# Patient Record
Sex: Male | Born: 1995 | Race: White | Hispanic: No | Marital: Married | Smoking: Never smoker
Health system: Southern US, Community
[De-identification: ages and names within clinical notes are randomized; demographics above are authoritative.]

## PROBLEM LIST (undated history)

## (undated) DIAGNOSIS — Z860100 Personal history of colon polyps, unspecified: Secondary | ICD-10-CM

## (undated) HISTORY — PX: OTHER SURGICAL HISTORY: SHX169

## (undated) HISTORY — DX: Personal history of colonic polyps: Z86.010

## (undated) HISTORY — DX: Personal history of colon polyps, unspecified: Z86.0100

---
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
## 2017-08-05 ENCOUNTER — Encounter: Payer: Self-pay | Admitting: Podiatry

## 2017-08-05 ENCOUNTER — Ambulatory Visit (INDEPENDENT_AMBULATORY_CARE_PROVIDER_SITE_OTHER): Payer: BLUE CROSS/BLUE SHIELD | Admitting: Podiatry

## 2017-08-05 DIAGNOSIS — L600 Ingrowing nail: Secondary | ICD-10-CM

## 2017-08-05 MED ORDER — DOXYCYCLINE HYCLATE 100 MG PO TABS: 100.0000 mg | ORAL_TABLET | Freq: Two times a day (BID) | ORAL | 0 refills | Status: DC

## 2017-08-05 NOTE — Patient Instructions (Signed)

## 2017-08-05 NOTE — Progress Notes (Signed)
   Subjective:    Patient ID: Andre Fisher, male    DOB: 06/04/1996, 21 y.o.   MRN: [id number]  HPI    Review of Systems     Objective:   Physical Exam        Assessment & Plan:

## 2017-08-08 NOTE — Progress Notes (Signed)
   Subjective: Patient presents today for evaluation of pain to the lateral border of the left great toe that has been present for the past 4-5 days. He reports associated redness and swelling to the area. Patient is concerned for possible ingrown nail. Patient presents today for further treatment and evaluation.   No past medical history on file.   Objective:  General: Well developed, nourished, in no acute distress, alert and oriented x3   Dermatology: Skin is warm, dry and supple bilateral. Lateral border of the left great toe appears to be erythematous with evidence of an ingrowing nail. Pain on palpation noted to the border of the nail fold. The remaining nails appear unremarkable at this time. There are no open sores, lesions.  Vascular: Dorsalis Pedis artery and Posterior Tibial artery pedal pulses palpable. No lower extremity edema noted.   Neruologic: Grossly intact via light touch bilateral.  Musculoskeletal: Muscular strength within normal limits in all groups bilateral. Normal range of motion noted to all pedal and ankle joints.   Assesement: #1 Paronychia with ingrowing nail lateral border of the left great toe #2 Pain in toe #3 Incurvated nail  Plan of Care:  1. Patient evaluated.  2. Discussed treatment alternatives and plan of care. Explained nail avulsion procedure and post procedure course to patient. 3. Patient opted for permanent partial nail avulsion.  4. Prior to procedure, local anesthesia infiltration utilized using 3 ml of a 50:50 mixture of 2% plain lidocaine and 0.5% plain marcaine in a normal hallux block fashion and a betadine prep performed.  5. Partial permanent nail avulsion with chemical matrixectomy performed using 3x30sec applications of phenol followed by alcohol flush.  6. Light dressing applied. 7. Prescription for doxycycline # 14 given to patient. 8. Return to clinic in 2 weeks.   Felecia ShellingBrent M. Avriana Joo, DPM [facility]  Dr. Felecia ShellingBrent M.  Cynai Skeens, DPM    [address]                                        [place], [place] [address]                Office [phone]) [phone]  Fax [phone]

## 2017-08-19 ENCOUNTER — Ambulatory Visit (INDEPENDENT_AMBULATORY_CARE_PROVIDER_SITE_OTHER): Payer: BLUE CROSS/BLUE SHIELD | Admitting: Podiatry

## 2017-08-19 DIAGNOSIS — L600 Ingrowing nail: Secondary | ICD-10-CM | POA: Diagnosis not present

## 2017-08-22 NOTE — Progress Notes (Signed)
   Subjective: Patient presents today 2 weeks post ingrown nail permanent nail avulsion procedure. Patient states that the toe and nail fold is feeling much better.  No past medical history on file.  Objective: Skin is warm, dry and supple. Nail and respective nail fold appears to be healing appropriately. Open wound to the associated nail fold with a granular wound base and moderate amount of fibrotic tissue. Minimal drainage noted. Mild erythema around the periungual region likely due to phenol chemical matricectomy.  Assessment: #1 postop permanent partial nail avulsion lateral border of left great toe #2 open wound periungual nail fold of respective digit.   Plan of care: #1 patient was evaluated  #2 debridement of open wound was performed to the periungual border of the respective toe using a currette. Antibiotic ointment and Band-Aid was applied. #3 patient is to return to clinic on a PRN  basis.   Felecia ShellingBrent M. Shalunda Lindh, DPM [facility]  Dr. Felecia ShellingBrent M. Kayston Jodoin, DPM    [address]                                        [place], [place] [address]                Office [phone]  Fax [phone]

## 2022-01-06 ENCOUNTER — Encounter: Payer: Self-pay | Admitting: Nurse Practitioner

## 2022-01-06 ENCOUNTER — Ambulatory Visit (INDEPENDENT_AMBULATORY_CARE_PROVIDER_SITE_OTHER): Payer: BC Managed Care – PPO | Admitting: Nurse Practitioner

## 2022-01-06 ENCOUNTER — Other Ambulatory Visit: Payer: Self-pay

## 2022-01-06 VITALS — BP 126/84 | HR 75 | Temp 98.0°F | Resp 14 | Ht 67.25 in | Wt 193.2 lb

## 2022-01-06 DIAGNOSIS — Z23 Encounter for immunization: Secondary | ICD-10-CM | POA: Diagnosis not present

## 2022-01-06 DIAGNOSIS — Z0000 Encounter for general adult medical examination without abnormal findings: Secondary | ICD-10-CM

## 2022-01-06 DIAGNOSIS — Z86010 Personal history of colonic polyps: Secondary | ICD-10-CM | POA: Diagnosis not present

## 2022-01-06 NOTE — Progress Notes (Signed)
? ?New Patient Office Visit ? ?Subjective:  ?Patient ID: Andre Fisher, male    DOB: [birth year]  Age: 26 y.o. MRN: [id number] ? ?CC:  ?Chief Complaint  ?Patient presents with  ? Establish Care  ?  Previous PCP with [facility].  ? ? ?HPI ?Andre Fisher presents for Establish care ? ?for complete physical and follow up of chronic conditions. ? ?Immunizations: ?-Tetanus: update today ?-Influenza: refused ?-Covid-19: refused ?-Shingles: NA ?-Pneumonia: NA ? ? ? ?Diet: Fair diet. Depends on how he feels at work. Lunch and dinner with snacks. Coffee, water and sodas.2-3 sodas a day ?Exercise: No regular exercise. Laborious employment ? ?Eye exam:  within the year. PRN ?Dental exam: Completes semi-annually.   ? ? ?Colonoscopy:  too young ?Dexa: Completed in ?PSA: Due ? ?Lung Cancer Screening: N/A ? ?Sleep: work nights goes to be 10-11 gets up approx 6am. Weekends 12-1 to 8-9. Feels rested. Snoring per wife's report. ? ?Past Medical History:  ?Diagnosis Date  ? History of colon polyps   ? ? ? ?Family History  ?Problem Relation Age of Onset  ? Hyperlipidemia Mother   ? Heart disease Mother   ? COPD Maternal Grandmother   ? Stroke Maternal Grandmother   ? Heart disease Maternal Grandmother   ? Heart attack Maternal Grandmother   ? Cirrhosis Maternal Grandfather   ? ? ?Social History  ? ?Socioeconomic History  ? Marital status: Married  ?  Spouse name: Not on file  ? Number of children: 0  ? Years of education: Not on file  ? Highest education level: Not on file  ?Occupational History  ? Not on file  ?Tobacco Use  ? Smoking status: Never  ? Smokeless tobacco: Never  ?Vaping Use  ? Vaping Use: Never used  ?Substance and Sexual Activity  ? Alcohol use: No  ? Drug use: Never  ? Sexual activity: Yes  ?Other Topics Concern  ? Not on file  ?Social History Narrative  ? 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   ? Hobbie: video games and being outside  ? ?Social Determinants of Health  ? ?Financial Resource Strain:  Not on file  ?Food Insecurity: Not on file  ?Transportation Needs: Not on file  ?Physical Activity: Not on file  ?Stress: Not on file  ?Social Connections: Not on file  ?Intimate Partner Violence: Not on file  ? ? ?ROS ?Review of Systems  ?Constitutional:  Negative for chills, fatigue and fever.  ?Eyes:  Negative for visual disturbance.  ?Respiratory:  Negative for cough and shortness of breath.   ?Cardiovascular:  Negative for chest pain and leg swelling.  ?Gastrointestinal:  Negative for diarrhea, nausea and vomiting.  ?     BM daily ?  ?Genitourinary:  Negative for difficulty urinating, dysuria, hematuria, penile discharge, penile pain, penile swelling, scrotal swelling and testicular pain.  ?     Nocturia "-"  ?Neurological:  Positive for headaches. Negative for weakness and numbness.  ?Psychiatric/Behavioral:  Negative for hallucinations and suicidal ideas.   ? ?Objective:  ? ?Today's Vitals: BP 126/84   Pulse 75   Temp 98 ?F (36.7 ?C)   Resp 14   Ht 5' 7.25" (1.708 m)   Wt 193 lb 4 oz (87.7 kg)   SpO2 97%   BMI 30.04 kg/m?  ? ?Physical Exam ?Vitals and nursing note reviewed. Exam conducted with a chaperone present Karie Georges, CMA).  ?Constitutional:   ?   Appearance: Normal appearance.  ?  HENT:  ?   Right Ear: Tympanic membrane, ear canal and external ear normal. There is no impacted cerumen.  ?   Left Ear: Tympanic membrane, ear canal and external ear normal. There is no impacted cerumen.  ?   Mouth/Throat:  ?   Mouth: Mucous membranes are moist.  ?   Pharynx: Oropharynx is clear.  ?Eyes:  ?   Extraocular Movements: Extraocular movements intact.  ?   Pupils: Pupils are equal, round, and reactive to light.  ?Neck:  ?   Thyroid: No thyroid mass, thyromegaly or thyroid tenderness.  ?Cardiovascular:  ?   Rate and Rhythm: Normal rate and regular rhythm.  ?   Pulses: Normal pulses.  ?   Heart sounds: Normal heart sounds.  ?Pulmonary:  ?   Effort: Pulmonary effort is normal.  ?   Breath sounds: Normal  breath sounds.  ?Abdominal:  ?   General: Bowel sounds are normal. There is no distension.  ?   Palpations: There is no mass.  ?   Tenderness: There is no abdominal tenderness.  ?   Hernia: No hernia is present. There is no hernia in the left inguinal area or right inguinal area.  ?Genitourinary: ?   Penis: Normal and circumcised.   ?   Testes: Normal.  ?   Epididymis:  ?   Right: Normal.  ?   Left: Normal.  ?Lymphadenopathy:  ?   Cervical: No cervical adenopathy.  ?   Lower Body: No right inguinal adenopathy. No left inguinal adenopathy.  ?Neurological:  ?   Mental Status: He is alert.  ? ? ?Assessment & Plan:  ? ?Problem List Items Addressed This Visit   ? ?  ? Other  ? Preventative health care - Primary  ?  Discussed age-appropriate immunizations and screening exams.  Did encourage healthy lifestyle modifications inclusive of 30 minutes of exercise daily 5 times a week.  Update tetanus shot today ?  ?  ? Relevant Orders  ? CBC  ? Comprehensive metabolic panel  ? Lipid panel  ? Hemoglobin A1c  ? TSH  ? HIV Antibody (routine testing w rflx)  ? Need for Td vaccine  ? Relevant Orders  ? Td : Tetanus/diphtheria >7yo Preservative  free (Completed)  ? History of colon polyps  ?  Accompanied by mom in office.  States that patient did have a polyp removed from his colon was approximately 5 years ago.  States they were able to take it by the stem and no other follow-up was required.  States that patient was likely born with a birth.  Did question whether this was a hemorrhoid or not mother states no that it was a polyp. ?  ?  ? ? ?Outpatient Encounter Medications as of 01/06/2022  ?Medication Sig  ? [DISCONTINUED] doxycycline (VIBRA-TABS) 100 MG tablet Take 1 tablet (100 mg total) by mouth 2 (two) times daily.  ? ?No facility-administered encounter medications on file as of 01/06/2022.  ? ? ?Follow-up: Return in about 1 year (around 01/07/2023) for cpe.  ? ?This visit occurred during the SARS-CoV-2 public health emergency.   Safety protocols were in place, including screening questions prior to the visit, additional usage of staff PPE, and extensive cleaning of exam room while observing appropriate contact time as indicated for disinfecting solutions.  ? ?Audria Nine, NP ? ?

## 2022-01-06 NOTE — Assessment & Plan Note (Signed)
Discussed age-appropriate immunizations and screening exams.  Did encourage healthy lifestyle modifications inclusive of 30 minutes of exercise daily 5 times a week.  Update tetanus shot today ?

## 2022-01-06 NOTE — Assessment & Plan Note (Signed)
Accompanied by mom in office.  States that patient did have a polyp removed from his colon was approximately 5 years ago.  States they were able to take it by the stem and no other follow-up was required.  States that patient was likely born with a birth.  Did question whether this was a hemorrhoid or not mother states no that it was a polyp. ?

## 2022-01-06 NOTE — Patient Instructions (Signed)
Nice to see you today ?I will be in touch in regards to your lab results ?Follow up with me in 1 year, sooner if you need me ? ?They recommend that you do 30 mins of activity a day 5 days out of the week ?

## 2022-01-07 ENCOUNTER — Ambulatory Visit: Payer: Self-pay | Admitting: Adult Health

## 2022-01-07 LAB — COMPREHENSIVE METABOLIC PANEL
ALT: 24 U/L (ref 0–53)
AST: 21 U/L (ref 0–37)
Albumin: 4.8 g/dL (ref 3.5–5.2)
Alkaline Phosphatase: 64 U/L (ref 39–117)
BUN: 13 mg/dL (ref 6–23)
CO2: 30 mEq/L (ref 19–32)
Calcium: 9.2 mg/dL (ref 8.4–10.5)
Chloride: 102 mEq/L (ref 96–112)
Creatinine, Ser: 0.99 mg/dL (ref 0.40–1.50)
GFR: 104.61 mL/min (ref >60.00)
Glucose, Bld: 79 mg/dL (ref 70–99)
Potassium: 4.5 mEq/L (ref 3.5–5.1)
Sodium: 140 mEq/L (ref 135–145)
Total Bilirubin: 0.6 mg/dL (ref 0.2–1.2)
Total Protein: 7 g/dL (ref 6.0–8.3)

## 2022-01-07 LAB — CBC
HCT: 42.9 % (ref 39.0–52.0)
Hemoglobin: 14.8 g/dL (ref 13.0–17.0)
MCHC: 34.4 g/dL (ref 30.0–36.0)
MCV: 85.7 fl (ref 78.0–100.0)
Platelets: 317 10*3/uL (ref 150.0–400.0)
RBC: 5.01 Mil/uL (ref 4.22–5.81)
RDW: 13.2 % (ref 11.5–15.5)
WBC: 7.1 10*3/uL (ref 4.0–10.5)

## 2022-01-07 LAB — LIPID PANEL
Cholesterol: 217 mg/dL — ABNORMAL HIGH (ref 0–200)
HDL: 46.8 mg/dL (ref >39.00)
LDL Cholesterol: 148 mg/dL — ABNORMAL HIGH (ref 0–99)
NonHDL: 170.18
Total CHOL/HDL Ratio: 5
Triglycerides: 113 mg/dL (ref 0.0–149.0)
VLDL: 22.6 mg/dL (ref 0.0–40.0)

## 2022-01-07 LAB — HIV ANTIBODY (ROUTINE TESTING W REFLEX): HIV 1&2 Ab, 4th Generation: NONREACTIVE

## 2022-01-07 LAB — HEMOGLOBIN A1C: Hgb A1c MFr Bld: 5.6 % (ref 4.6–6.5)

## 2022-01-07 LAB — TSH: TSH: 2.37 u[IU]/mL (ref 0.35–5.50)

## 2022-01-13 ENCOUNTER — Telehealth: Payer: Self-pay

## 2022-01-13 NOTE — Telephone Encounter (Signed)
Spoke with patient, I called to relay lab results. ?

## 2022-01-13 NOTE — Telephone Encounter (Signed)
[facility] Night - Client ?Nonclinical Telephone Record  ?AccessNurse? ?Client [facility] Night - Client ?Client Site [facility] - Night ?Provider AA - PHYSICIAN, UNKNOWN- MD ?Contact Type Call ?Who Is Calling Patient / Member / Family / Caregiver ?Caller Name Andre Fisher ?Caller Phone Number [phone] ?Call Type Message Only Information Provided ?Reason for Call Returning a Call from the Office ?Initial Comment Caller says that he had a call a bit ago but missed the call and wants to know why they called. ?Disp. Time Disposition Final User ?01/12/2022 5:03:13 PM General Information Provided Yes Einar Pheasant ?Call Closed By: Einar Pheasant ?Transaction Date/Time: 01/12/2022 5:01:12 PM (ET ?

## 2022-03-05 ENCOUNTER — Ambulatory Visit (INDEPENDENT_AMBULATORY_CARE_PROVIDER_SITE_OTHER)
Admission: RE | Admit: 2022-03-05 | Discharge: 2022-03-05 | Disposition: A | Discharge status: Home / Self Care | Payer: BC Managed Care – PPO | Source: Ambulatory Visit | Attending: Nurse Practitioner | Admitting: Nurse Practitioner

## 2022-03-05 ENCOUNTER — Ambulatory Visit: Payer: BC Managed Care – PPO | Admitting: Nurse Practitioner

## 2022-03-05 ENCOUNTER — Encounter: Payer: Self-pay | Admitting: Nurse Practitioner

## 2022-03-05 VITALS — BP 116/62 | HR 73 | Temp 97.5°F | Wt 190.4 lb

## 2022-03-05 DIAGNOSIS — G8929 Other chronic pain: Secondary | ICD-10-CM

## 2022-03-05 DIAGNOSIS — M542 Cervicalgia: Secondary | ICD-10-CM | POA: Diagnosis not present

## 2022-03-05 DIAGNOSIS — M546 Pain in thoracic spine: Secondary | ICD-10-CM | POA: Diagnosis not present

## 2022-03-05 DIAGNOSIS — M40202 Unspecified kyphosis, cervical region: Secondary | ICD-10-CM | POA: Diagnosis not present

## 2022-03-05 MED ORDER — CYCLOBENZAPRINE HCL 5 MG PO TABS: 5.0000 mg | ORAL_TABLET | Freq: Two times a day (BID) | ORAL | 0 refills | Status: DC | PRN

## 2022-03-05 NOTE — Patient Instructions (Signed)
Nice to see you today I will be in touch with xray results I sent in the muscle relaxer. This can cause you to be sleepy. Follow up if no improvement

## 2022-03-05 NOTE — Progress Notes (Signed)
Acute Office Visit  Subjective:     Patient ID: Andre Fisher, male    DOB: [birth year], 26 y.o.   MRN: [id number]  Chief Complaint  Patient presents with   Neck Pain    Neck pain,for a while, comes and goes. Some days are better than others.   Back Pain    Mid and lower back pain, some days are better than others. Always feels pain at the end of day after work. Used to take Bayer back and body. Has been going on for years.      Patient is in today for back pain  Neck: approx a month ago. States that the pain is intermittent. Describes the pain as a tightness. Worse at work. Has tried bayer back and body in the past but this was before the neck are bothering him.  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  Back: Bothering him for years. 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 States somedays are better than others. The more physical the day the worse it is. States that he has seen a Land. Felt the chiropractor was helpful. States he will take bayer back and body, with minimal relief Xrays approx 2 years ago his chiropractor that showed curvature discrepancies in regards to the spine and iliac crest symmetry per x-rays on patient's phone. Has not done PT in the past.  At the end of the office visit patient had concern for lump to his right lower thoracic right side medial back.  This was an incidental finding by patient's spouse.  It is nontender nonrigid movable.  Review of Systems  Constitutional:  Negative for chills and fever.  Genitourinary:        No B&B involvement   Musculoskeletal:  Positive for back pain and neck pain.  Neurological:  Negative for tingling and weakness.       Objective:    BP 116/62 (BP Location: Left Arm, Patient Position: Sitting, Cuff Size: Normal)   Pulse 73   Temp (!) 97.5 F (36.4 C) (Temporal)   Wt 190 lb 6.4 oz (86.4 kg)   SpO2 97%   BMI 29.60 kg/m    Physical Exam Vitals and nursing note reviewed.   Constitutional:      Appearance: Normal appearance.  Cardiovascular:     Rate and Rhythm: Normal rate and regular rhythm.     Pulses: Normal pulses.     Heart sounds: Normal heart sounds.  Pulmonary:     Effort: Pulmonary effort is normal.     Breath sounds: Normal breath sounds.  Musculoskeletal:        General: Tenderness present. No signs of injury.     Cervical back: Tenderness present. No bony tenderness or crepitus. No pain with movement.     Thoracic back: Tenderness present. No swelling, deformity or bony tenderness. Normal range of motion.     Lumbar back: No tenderness. Negative right straight leg raise test and negative left straight leg raise test.       Back:     Right lower leg: No edema.     Left lower leg: No edema.  Skin:    General: Skin is warm.       Neurological:     General: No focal deficit present.     Mental Status: He is alert.     Comments: Bilateral upper and lower extremity strength 5/5    No results found for any visits on 03/05/22.  Assessment & Plan:   Problem List Items Addressed This Visit       Other   Chronic bilateral thoracic back pain    Likely musculature in nature given exam and patient's presentation.  Patient requested and wanted to proceed with x-rays today.  We will obtain thoracic and cervical views.  Patient can take Flexeril 5 mg twice daily as needed.  Sedation precaution reviewed with patient       Relevant Medications   cyclobenzaprine (FLEXERIL) 5 MG tablet   Other Relevant Orders   DG Thoracic Spine W/Swimmers   Neck pain - Primary    Paraspinal area.  Musculature in nature we will start Flexeril 5 mg twice daily as needed, sedation precautions reviewed.  Patient would like to have x-ray performed in office today cervical spine x-ray ordered, pending result       Relevant Medications   cyclobenzaprine (FLEXERIL) 5 MG tablet   Other Relevant Orders   DG Cervical Spine Complete    Meds ordered this  encounter  Medications   cyclobenzaprine (FLEXERIL) 5 MG tablet    Sig: Take 1 tablet (5 mg total) by mouth 2 (two) times daily as needed for muscle spasms.    Dispense:  20 tablet    Refill:  0    Order Specific Question:   Supervising Provider    Answer:   TOWER, MARNE A [[id number]    Return if symptoms worsen or fail to improve.  Audria Nine, NP

## 2022-03-05 NOTE — Assessment & Plan Note (Signed)
Paraspinal area.  Musculature in nature we will start Flexeril 5 mg twice daily as needed, sedation precautions reviewed.  Patient would like to have x-ray performed in office today cervical spine x-ray ordered, pending result

## 2022-03-05 NOTE — Assessment & Plan Note (Signed)
Likely musculature in nature given exam and patient's presentation.  Patient requested and wanted to proceed with x-rays today.  We will obtain thoracic and cervical views.  Patient can take Flexeril 5 mg twice daily as needed.  Sedation precaution reviewed with patient

## 2022-03-07 ENCOUNTER — Encounter: Payer: Self-pay | Admitting: Nurse Practitioner

## 2022-03-07 DIAGNOSIS — G8929 Other chronic pain: Secondary | ICD-10-CM

## 2022-03-09 NOTE — Addendum Note (Signed)
Addended by: Michela Pitcher on: 03/09/2022 08:29 AM   Modules accepted: Orders

## 2022-04-08 ENCOUNTER — Telehealth: Payer: Self-pay | Admitting: Physical Therapy

## 2022-04-08 NOTE — Telephone Encounter (Signed)
Called pt to inquiry about whether he wanted to come in earlier to complete eval. He reports not being able to get off from work and that the soonest he could come in is on Monday.

## 2022-04-12 ENCOUNTER — Ambulatory Visit: Payer: BC Managed Care – PPO | Admitting: Physical Therapy

## 2022-04-19 ENCOUNTER — Encounter: Payer: Self-pay | Admitting: Physical Therapy

## 2022-04-19 ENCOUNTER — Ambulatory Visit: Payer: BC Managed Care – PPO | Attending: Nurse Practitioner | Admitting: Physical Therapy

## 2022-04-19 ENCOUNTER — Other Ambulatory Visit: Payer: Self-pay

## 2022-04-19 DIAGNOSIS — M5459 Other low back pain: Secondary | ICD-10-CM | POA: Diagnosis not present

## 2022-04-19 NOTE — Therapy (Unsigned)
OUTPATIENT PHYSICAL THERAPY LOWER EXTREMITY EVALUATION   Patient Name: Andre Fisher MRN: [id number] DOB:21-Apr-1996, 26 y.o., male Today's Date: 04/19/2022    Past Medical History:  Diagnosis Date   History of colon polyps    Past Surgical History:  Procedure Laterality Date   polyp removed     under right arm pit   Patient Active Problem List   Diagnosis Date Noted   Chronic bilateral thoracic back pain 03/05/2022   Neck pain 03/05/2022   Preventative health care 01/06/2022   Need for Td vaccine 01/06/2022   History of colon polyps 01/06/2022    PCP: ***  REFERRING PROVIDER: ***  REFERRING DIAG: ***  THERAPY DIAG:  No diagnosis found.  Rationale for Evaluation and Treatment Rehabilitation  ONSET DATE: ***  SUBJECTIVE:   SUBJECTIVE STATEMENT: Pt reports that his back has hurt for years, but his neck has started to hurt more as of late.   PERTINENT HISTORY: M54.6,G89.29 (ICD-10-CM) - Chronic bilateral thoracic back pain  Per Dr. Fayrene Fearing note on 03/05/22   Chronic bilateral thoracic back pain       Likely musculature in nature given exam and patient's presentation.  Patient requested and wanted to proceed with x-rays today.  We will obtain thoracic and cervical views.  Patient can take Flexeril 5 mg twice daily as needed.  Sedation precaution reviewed with patient  PAIN:  Are you having pain? Yes: NPRS scale: 5/10 Pain location: C8 Vertebrate and thoracic paraspinals  Pain description: None listed  Aggravating factors: lifting heavy objects  Relieving factors: Laying on hard floor or surface   PRECAUTIONS: None  WEIGHT BEARING RESTRICTIONS No  FALLS:  Has patient fallen in last 6 months? No  LIVING ENVIRONMENT: Lives with: lives with their spouse Lives in: House/apartment Stairs: Yes: External: 3 steps; on right going up and on left going up Has following equipment at home: None  OCCUPATION: Marketing executive Supply Chain Specialist   PLOF:  Independent  PATIENT GOALS Reduce pain    OBJECTIVE:               VITALS: BP 110/66 SpO2 98 HR 70  DIAGNOSTIC FINDINGS: CLINICAL DATA:  Thoracic pain.   EXAM: THORACIC SPINE - 3 VIEWS   COMPARISON:  None Available.   FINDINGS: There is no evidence of thoracic spine fracture. Alignment is normal. No other significant bone abnormalities are identified.   IMPRESSION: Negative.     Electronically Signed   By: Darliss Cheney M.D.   On: 03/06/2022 23:13   CLINICAL DATA:  Intermittent cervicalgia.   EXAM: CERVICAL SPINE - COMPLETE 4+ VIEW   COMPARISON:  None Available.   FINDINGS: There is no radiographic evidence of cervical spine fracture or prevertebral soft tissue swelling. There is a slight cervical kyphosis without evidence of listhesis. No other significant bone abnormalities are identified. Lung apices are clear.   IMPRESSION: Slight cervical kyphosis. No evidence of fracture, listhesis or degenerative change.     Electronically Signed   By: Almira Bar M.D.   On: 03/06/2022 23:15  PATIENT SURVEYS:  FOTO ***  COGNITION:  Overall cognitive status: Within functional limits for tasks assessed     SENSATION: WFL  EDEMA: N/a  MUSCLE LENGTH: Hamstrings: Right *** deg; Left *** deg [name] test: Right *** deg; Left *** deg  POSTURE: rounded shoulders  PALPATION: ***  CERVICAL:                                           AROM   PROM          NORM               flex:    50*         50*                         50                     ext:   60         60                          60           AROM R/L      PROM                          NORM     SB:                                                                       45  rot:                                                                    70-90                                 R      L    Normal   - shoulder flx     180 180          180  - shoulder abd    180 180          180  - shoulder ext     60    60             60  - combined mvt      (touch pt)         - ER       Occiput  Occiput  Occiput         - IR            T7        T7            T7 ER @ 90 deg abd   110 110            90 IR @ 90 deg abd    70   70              70    STRENGTH   Strength R/L 5/5 Shoulder flexion (  anterior deltoid/pec major/coracobrachialis, axillary n. (C5/6) and musculocutaneous n. (C5-7)) 5/5 Shoulder abduction (deltoid/supraspinatus, axillary/suprascapular n, C5) 5/5 Shoulder external rotation (infraspinatus/teres minor) 5/5 Shoulder internal rotation (subcapularis/lats/pec major) 5/5 Shoulder extension (posterior deltoid, lats, teres major, axillary/thoracodorsal n.) 5/5 Shoulder horizontal abduction 5/5 Elbow flexion (biceps brachii, brachialis, brachioradialis, musculoskeletal n, C5/6) 5/5 Elbow extension (triceps, radial n, C7) 5/5 Wrist Extension (C6/7) 5/5 Wrist Flexion (C6/7) 5/5 Finger adduction (interossei, ulnar n, T1) Cervical isometrics are strong in all directions;  Low Back:  Lumbar                Flexion: 100%              Extension: 100%             Side Bend R/L 100% 100%             Rotation R/L* 100%  Active ROM Right eval Left eval  Hip flexion    Hip extension    Hip abduction    Hip adduction    Hip internal rotation    Hip external rotation    Knee flexion    Knee extension    Ankle dorsiflexion    Ankle plantarflexion    Ankle inversion    Ankle eversion     (Blank rows = not tested)  LOWER EXTREMITY MMT:  MMT Right eval Left eval  Hip flexion    Hip extension    Hip abduction    Hip adduction    Hip internal rotation    Hip external rotation    Knee flexion    Knee extension    Ankle dorsiflexion    Ankle plantarflexion    Ankle inversion    Ankle eversion     (Blank rows = not tested)  LOWER EXTREMITY SPECIAL TESTS:  Hip special tests: Luisa Hart (FABER) test: negative, Thomas test: negative, Ober's test:  negative, Craig's test: negative, and Ely's test: negative  FUNCTIONAL TESTS:  {Functional tests:24029}  GAIT: Distance walked: *** Assistive device utilized: {Assistive devices:23999} Level of assistance: {Levels of assistance:24026} Comments: ***    TODAY'S TREATMENT: Levator Scapulae Stretch 2 x 30 sec     PATIENT EDUCATION:  Education details: *** Person educated: {Person educated:25204} Education method: {Education Method:25205} Education comprehension: {Education Comprehension:25206}   HOME EXERCISE PROGRAM: Access Code: [email or web address] URL: [email or web address] Date: 04/19/2022 Prepared by: Ellin Goodie  Exercises - Seated Upper Trapezius Stretch  - 1 x daily - 7 x weekly - 1 sets - 3 reps - 30 hold - Seated Levator Scapulae Stretch  - 1 x daily - 7 x weekly - 1 sets - 3 reps - 30 hold  ASSESSMENT:  CLINICAL IMPRESSION: Patient is a *** y.o. *** who was seen today for physical therapy evaluation and treatment for ***.    OBJECTIVE IMPAIRMENTS {opptimpairments:25111}.   ACTIVITY LIMITATIONS {activitylimitations:27494}  PARTICIPATION LIMITATIONS: {participationrestrictions:25113}  PERSONAL FACTORS {Personal factors:25162} are also affecting patient's functional outcome.   REHAB POTENTIAL: {rehabpotential:25112}  CLINICAL DECISION MAKING: {clinical decision making:25114}  EVALUATION COMPLEXITY: {Evaluation complexity:25115}   GOALS: Goals reviewed with patient? {yes/no:20286}  SHORT TERM GOALS: Target date: {follow up:25551}  *** Baseline: Goal status: {GOALSTATUS:25110}  2.  *** Baseline:  Goal status: {GOALSTATUS:25110}  3.  *** Baseline:  Goal status: {GOALSTATUS:25110}  4.  *** Baseline:  Goal status: {GOALSTATUS:25110}  5.  *** Baseline:  Goal status: {GOALSTATUS:25110}  6.  *** Baseline:  Goal status: {GOALSTATUS:25110}  LONG TERM GOALS: Target date: {follow up:25551}   *** Baseline:  Goal status:  {GOALSTATUS:25110}  2.  *** Baseline:  Goal status: {GOALSTATUS:25110}  3.  *** Baseline:  Goal status: {GOALSTATUS:25110}  4.  *** Baseline:  Goal status: {GOALSTATUS:25110}  5.  *** Baseline:  Goal status: {GOALSTATUS:25110}  6.  *** Baseline:  Goal status: {GOALSTATUS:25110}   PLAN: PT FREQUENCY: {rehab frequency:25116}  PT DURATION: {rehab duration:25117}  PLANNED INTERVENTIONS: {rehab planned interventions:25118::"Therapeutic exercises","Therapeutic activity","Neuromuscular re-education","Balance training","Gait training","Patient/Family education","Joint mobilization"}  PLAN FOR NEXT SESSION: ***   Johnn Hai, PT 04/19/2022, 6:12 PM

## 2022-04-21 ENCOUNTER — Ambulatory Visit: Payer: BC Managed Care – PPO | Admitting: Physical Therapy

## 2022-04-21 ENCOUNTER — Telehealth: Payer: Self-pay | Admitting: Physical Therapy

## 2022-04-21 NOTE — Telephone Encounter (Signed)
Attempted to call pt to inform him that original POC was for frequency of 1x per week instead of 2x per week. Unable to reach and unable to leave VM.

## 2022-04-26 ENCOUNTER — Encounter: Payer: BC Managed Care – PPO | Admitting: Physical Therapy

## 2022-04-28 ENCOUNTER — Encounter: Payer: BC Managed Care – PPO | Admitting: Physical Therapy

## 2022-04-29 ENCOUNTER — Encounter: Payer: Self-pay | Admitting: Physical Therapy

## 2022-04-29 ENCOUNTER — Ambulatory Visit: Payer: BC Managed Care – PPO | Admitting: Physical Therapy

## 2022-04-29 DIAGNOSIS — M5459 Other low back pain: Secondary | ICD-10-CM | POA: Diagnosis not present

## 2022-04-29 NOTE — Therapy (Signed)
OUTPATIENT PHYSICAL THERAPY TREATMENT NOTE   Patient Name: Andre Fisher MRN: [id number] DOB:June 29, 1996, 26 y.o., male Today's Date: 04/29/2022  PCP: Dr. Mordecai Maes  REFERRING PROVIDER: Dr. Mordecai Maes   END OF SESSION:   PT End of Session - 04/29/22 1554     Visit Number 2    Number of Visits 6    Date for PT Re-Evaluation 06/01/22    Authorization Type [employer] 60 PT/OT/ST    Authorization - Visit Number 2    Authorization - Number of Visits 60    Progress Note Due on Visit 10    PT Start Time 1550    PT Stop Time 1630    PT Time Calculation (min) 40 min    Activity Tolerance Patient tolerated treatment well    Behavior During Therapy [facility] for tasks assessed/performed             Past Medical History:  Diagnosis Date   History of colon polyps    Past Surgical History:  Procedure Laterality Date   polyp removed     under right arm pit   Patient Active Problem List   Diagnosis Date Noted   Chronic bilateral thoracic back pain 03/05/2022   Neck pain 03/05/2022   Preventative health care 01/06/2022   Need for Td vaccine 01/06/2022   History of colon polyps 01/06/2022    REFERRING DIAG: M54.6,G89.29 (ICD-10-CM) - Chronic bilateral thoracic back pain  THERAPY DIAG:  Other low back pain  Rationale for Evaluation and Treatment Rehabilitation  PERTINENT HISTORY:    Per Dr. Fayrene Fearing note on 03/05/22    Chronic bilateral thoracic back pain                                   Likely musculature in nature given exam and patient's presentation.  Patient requested and wanted to proceed with x-rays today.  We will obtain thoracic and cervical views.  Patient can take Flexeril 5 mg twice daily as needed.  Sedation precaution reviewed with patient  PRECAUTIONS: None   SUBJECTIVE: Pt reports ongoing neck pain and soreness. He experiences most of this pain while at work when looking up and down.  PAIN:  Are you having pain? Yes: NPRS scale: 4-5/10 Pain location: C7 right side  of spinous process  Pain description: Achy Aggravating factors: Flexing and extending head  Relieving factors: Not moving head    OBJECTIVE: (objective measures completed at initial evaluation unless otherwise dated)              VITALS: BP 110/66 SpO2 98 HR 70   DIAGNOSTIC FINDINGS: CLINICAL DATA:  Thoracic pain.   EXAM: THORACIC SPINE - 3 VIEWS   COMPARISON:  None Available.   FINDINGS: There is no evidence of thoracic spine fracture. Alignment is normal. No other significant bone abnormalities are identified.   IMPRESSION: Negative.     Electronically Signed   By: Darliss Cheney M.D.   On: 03/06/2022 23:13     CLINICAL DATA:  Intermittent cervicalgia.   EXAM: CERVICAL SPINE - COMPLETE 4+ VIEW   COMPARISON:  None Available.   FINDINGS: There is no radiographic evidence of cervical spine fracture or prevertebral soft tissue swelling. There is a slight cervical kyphosis without evidence of listhesis. No other significant bone abnormalities are identified. Lung apices are clear.   IMPRESSION: Slight cervical kyphosis. No evidence of fracture, listhesis or degenerative change.  Electronically Signed   By: Almira Bar M.D.   On: 03/06/2022 23:15   PATIENT SURVEYS:  FOTO 70/77   COGNITION:           Overall cognitive status: Within functional limits for tasks assessed                          SENSATION: WFL   EDEMA: N/a   MUSCLE LENGTH: Hamstrings: Right 90 deg; Left 90 deg Thomas test: Negative bilateral    POSTURE: rounded shoulders   PALPATION: C7 spinous process and thoracic paraspinals                           CERVICAL:                                           AROM   PROM          NORM               flex:    50*         50*                         50                     ext:   60         60                          60            AROM R/L      PROM                            NORM     SB:       45/45                                                           45  rot:     70/70                                                              70-90                                  R      L    Normal   - shoulder flx     180 180          180  - shoulder abd    180 180         180  - shoulder ext     60    60             60  - combined mvt      (  touch pt)         - ER       Occiput  Occiput  Occiput         - IR            T7        T7            T7 ER @ 90 deg abd   110 110            90 IR @ 90 deg abd    70   70              70       STRENGTH    Strength R/L 5/5 Shoulder flexion (anterior deltoid/pec major/coracobrachialis, axillary n. (C5/6) and musculocutaneous n. (C5-7)) 5/5 Shoulder abduction (deltoid/supraspinatus, axillary/suprascapular n, C5) 5/5 Shoulder external rotation (infraspinatus/teres minor) 5/5 Shoulder internal rotation (subcapularis/lats/pec major) 5/5 Shoulder extension (posterior deltoid, lats, teres major, axillary/thoracodorsal n.) 5/5 Shoulder horizontal abduction 5/5 Elbow flexion (biceps brachii, brachialis, brachioradialis, musculoskeletal n, C5/6) 5/5 Elbow extension (triceps, radial n, C7) 5/5 Wrist Extension (C6/7) 5/5 Wrist Flexion (C6/7) 5/5 Finger adduction (interossei, ulnar n, T1) Cervical isometrics are strong in all directions;   Parascapular Strength  5/5 Mid Trap  4/4  Lower Trap  4/4  Serratus        Lumbar                 Flexion: 100%              Extension: 100%             Side Bend R/L 100% 100%             Rotation R/L* 100%   100%     Active  Right 04/29/2022 Left 04/29/2022  Hip flexion 120 120  Hip extension 30 30  Hip abduction 45 45  Hip adduction 30 30  Hip internal rotation 45 45  Hip external rotation 45 45  Knee flexion 135 135  Knee extension 0 0  Ankle dorsiflexion 20 20  Ankle plantarflexion 50 50  Ankle inversion 35 35  Ankle eversion 15 15   (Blank rows = not tested)        LOWER EXTREMITY MMT:   MMT Right eval Left eval   Hip flexion 5 5  Hip extension 5 5  Hip abduction 5 5  Hip adduction      Hip internal rotation 5 5  Hip external rotation 5 5  Knee flexion 5 5  Knee extension 5 5  Ankle dorsiflexion 5 5  Ankle plantarflexion 5 5  Ankle inversion      Ankle eversion       (Blank rows = not tested)   LOWER EXTREMITY SPECIAL TESTS:  Hip special tests: Luisa Hart (FABER) test: negative, Thomas test: negative, Ober's test: negative, Craig's test: negative, and Ely's test: negative   FUNCTIONAL TESTS:  None performed    GAIT: Distance walked: 40 ft  Assistive device utilized: None Level of assistance: Complete Independence Comments: No gait abnormalities noted        TODAY'S TREATMENT: 04/29/22:           Lower Trap MMT 4/4           Mid Trap MMT 5/5            Serratus MMT 4/4  D2 PNF Pattern with Green TB 3 x 10  Wall Push Up with Plus 3 x 10             Omega Face Pulls #5 3 x 10             -min VC to decrease thoracic extension            Seated Rows #5 3 x 10             Decreased left cervical side bend compared to right   Initial: Levator Scapulae Stretch 2 x 30 sec  Upper Trap Stretch 2 x 30 sec        PATIENT EDUCATION:  Education details: form and technique for appropriate exercise  Person educated: Patient and Spouse Education method: Explanation, Facilities manager, Verbal cues, and Handouts Education comprehension: verbalized understanding, returned demonstration, and verbal cues required     HOME EXERCISE PROGRAM: Access Code: [email or web address] URL: [email or web address] Date: 04/29/2022 Prepared by: Ellin Goodie  Exercises - Seated Upper Trapezius Stretch  - 1 x daily - 7 x weekly - 1 sets - 3 reps - 30 hold - Seated Levator Scapulae Stretch  - 1 x daily - 7 x weekly - 1 sets - 3 reps - 30 hold - Face Pulls  - 1 x daily - 3 x weekly - 3 sets - 10 reps - Wall Push Up with Plus  - 1 x daily - 3 x weekly - 3 sets - 10 reps - Shoulder PNF D2 with  Resistance  - 1 x daily - 3 x weekly - 3 sets - 10 reps - Seated Row Cable Machine  - 1 x daily - 3 x weekly - 3 sets - 10 reps   ASSESSMENT:   CLINICAL IMPRESSION: Pt continues to show decreased left cervical side bend that is due to increased muscular tension in right trap. Increased left cervical side bend with right shoulder elevation. Pt also shows decrease in parascapular strength. He was able to complete all exercises without an increase in pain and parascapular exercises were progressed. He will continue to benefit from skilled PT to decrease cervical and thoracic pain to carry out lifting and bending required for job related tasks.     OBJECTIVE IMPAIRMENTS pain.    ACTIVITY LIMITATIONS lifting and bending   PARTICIPATION LIMITATIONS: occupation   PERSONAL FACTORS Profession are also affecting patient's functional outcome.    REHAB POTENTIAL: Excellent   CLINICAL DECISION MAKING: Stable/uncomplicated   EVALUATION COMPLEXITY: Low     GOALS: Goals reviewed with patient? No   SHORT TERM GOALS: Target date: 05/04/2022  Pt will be independent with HEP in order to improve strength and balance in order to decrease fall risk and improve function at home and work. Baseline: NT  Goal status: INITIAL     LONG TERM GOALS: Target date: 06/01/2022    Patient will have improved function and activity level as evidenced by an increase in FOTO score by 7 points.   Baseline: 70  Goal status: INITIAL   2.  Patient will increase parascapular strength by >=1/2 MMT to offload mechanical forces on spine to be able to lift boxes for job with less pain.  Baseline: NT Goal status: INITIAL     PLAN: PT FREQUENCY: 1-2x/week   PT DURATION: 6 weeks   PLANNED INTERVENTIONS: Therapeutic exercises, Therapeutic activity, Balance training, Gait training, Patient/Family education, Joint manipulation, Joint mobilization, Aquatic Therapy, Dry Needling, Electrical stimulation, Cryotherapy, Moist  heat, Manual therapy, and Re-evaluation   PLAN FOR NEXT SESSION: Progress  parascapular strength, sitting posture and lifting posture    Ellin Goodieaniel Gwenivere Hiraldo PT, DPT  04/29/2022, 3:55 PM

## 2022-05-03 ENCOUNTER — Encounter: Payer: BC Managed Care – PPO | Admitting: Physical Therapy

## 2022-05-05 ENCOUNTER — Encounter: Payer: BC Managed Care – PPO | Admitting: Physical Therapy

## 2022-05-10 ENCOUNTER — Encounter: Payer: BC Managed Care – PPO | Admitting: Physical Therapy

## 2022-05-12 ENCOUNTER — Encounter: Payer: BC Managed Care – PPO | Admitting: Physical Therapy

## 2022-05-13 ENCOUNTER — Encounter: Payer: Self-pay | Admitting: Physical Therapy

## 2022-05-13 ENCOUNTER — Ambulatory Visit: Payer: BC Managed Care – PPO | Admitting: Physical Therapy

## 2022-05-13 DIAGNOSIS — M5459 Other low back pain: Secondary | ICD-10-CM

## 2022-05-13 NOTE — Therapy (Signed)
OUTPATIENT PHYSICAL THERAPY TREATMENT NOTE   Patient Name: Andre ParodyDylan Fisher MRN: [id number] DOB:12/10/1995, 26 y.o., male Today's Date: 05/13/2022  PCP: Dr. Mordecai MaesJames Cable  REFERRING PROVIDER: Dr. Mordecai MaesJames Cable   END OF SESSION:   PT End of Session - 05/13/22 0853     Visit Number 3    Number of Visits 6    Date for PT Re-Evaluation 06/01/22    Authorization Type BC[employer] 60 PT/OT/ST    Authorization - Visit Number 3    Authorization - Number of Visits 60    Progress Note Due on Visit 10    PT Start Time 0850    PT Stop Time 0930    PT Time Calculation (min) 40 min    Activity Tolerance Patient tolerated treatment well    [facility] assessed/performed             Past Medical History:  Diagnosis Date   History of colon polyps    Past Surgical History:  Procedure Laterality Date   polyp removed     under right arm pit   Patient Active Problem List   Diagnosis Date Noted   Chronic bilateral thoracic back pain 03/05/2022   Neck pain 03/05/2022   Preventative health care 01/06/2022   Need for Td vaccine 01/06/2022   History of colon polyps 01/06/2022    REFERRING DIAG: M54.6,G89.29 (ICD-10-CM) - Chronic bilateral thoracic back pain  THERAPY DIAG:  Other low back pain  Rationale for Evaluation and Treatment Rehabilitation  PERTINENT HISTORY:    Per Dr. Fayrene FearingJames note on 03/05/22    Chronic bilateral thoracic back pain                                   Likely musculature in nature given exam and patient's presentation.  Patient requested and wanted to proceed with x-rays today.  We will obtain thoracic and cervical views.  Patient can take Flexeril 5 mg twice daily as needed.  Sedation precaution reviewed with patient  PRECAUTIONS: None   SUBJECTIVE: Pt reports some relief of pain and soreness in neck and shoulder blades. He describes feeling some stiffness because he has been working increased hours for past couple of days.   PAIN:  Are you  having pain? Yes: NPRS scale: 4-5/10 Pain location: C7 right side of spinous process  Pain description: Achy Aggravating factors: Flexing and extending head  Relieving factors: Not moving head    OBJECTIVE: (objective measures completed at initial evaluation unless otherwise dated)              VITALS: BP 110/66 SpO2 98 HR 70   DIAGNOSTIC FINDINGS: CLINICAL DATA:  Thoracic pain.   EXAM: THORACIC SPINE - 3 VIEWS   COMPARISON:  None Available.   FINDINGS: There is no evidence of thoracic spine fracture. Alignment is normal. No other significant bone abnormalities are identified.   IMPRESSION: Negative.     Electronically Signed   By: Darliss CheneyAmy  Guttmann M.D.   On: 03/06/2022 23:13     CLINICAL DATA:  Intermittent cervicalgia.   EXAM: CERVICAL SPINE - COMPLETE 4+ VIEW   COMPARISON:  None Available.   FINDINGS: There is no radiographic evidence of cervical spine fracture or prevertebral soft tissue swelling. There is a slight cervical kyphosis without evidence of listhesis. No other significant bone abnormalities are identified. Lung apices are clear.   IMPRESSION: Slight cervical kyphosis. No  evidence of fracture, listhesis or degenerative change.     Electronically Signed   By: Almira Bar M.D.   On: 03/06/2022 23:15   PATIENT SURVEYS:  FOTO 70/77   COGNITION:           Overall cognitive status: Within functional limits for tasks assessed                          SENSATION: WFL   EDEMA: N/a   MUSCLE LENGTH: Hamstrings: Right 90 deg; Left 90 deg Thomas test: Negative bilateral    POSTURE: rounded shoulders   PALPATION: C7 spinous process and thoracic paraspinals                           CERVICAL:                                           AROM   PROM          NORM               flex:    50*         50*                         50                     ext:   60         60                          60            AROM R/L      PROM                             NORM     SB:       45/45                                                          45  rot:     70/70                                                              70-90                                  R      L    Normal   - shoulder flx     180 180          180  - shoulder abd    180 180         180  - shoulder ext     60    60  60  - combined mvt      (touch pt)         - ER       Occiput  Occiput  Occiput         - IR            T7        T7            T7 ER @ 90 deg abd   110 110            90 IR @ 90 deg abd    70   70              70       STRENGTH    Strength R/L 5/5 Shoulder flexion (anterior deltoid/pec major/coracobrachialis, axillary n. (C5/6) and musculocutaneous n. (C5-7)) 5/5 Shoulder abduction (deltoid/supraspinatus, axillary/suprascapular n, C5) 5/5 Shoulder external rotation (infraspinatus/teres minor) 5/5 Shoulder internal rotation (subcapularis/lats/pec major) 5/5 Shoulder extension (posterior deltoid, lats, teres major, axillary/thoracodorsal n.) 5/5 Shoulder horizontal abduction 5/5 Elbow flexion (biceps brachii, brachialis, brachioradialis, musculoskeletal n, C5/6) 5/5 Elbow extension (triceps, radial n, C7) 5/5 Wrist Extension (C6/7) 5/5 Wrist Flexion (C6/7) 5/5 Finger adduction (interossei, ulnar n, T1) Cervical isometrics are strong in all directions;   Parascapular Strength  5/5 Mid Trap  4/4  Lower Trap  4/4  Serratus        Lumbar                 Flexion: 100%              Extension: 100%             Side Bend R/L 100% 100%             Rotation R/L* 100%   100%     Active  Right 04/29/2022 Left 04/29/2022  Hip flexion 120 120  Hip extension 30 30  Hip abduction 45 45  Hip adduction 30 30  Hip internal rotation 45 45  Hip external rotation 45 45  Knee flexion 135 135  Knee extension 0 0  Ankle dorsiflexion 20 20  Ankle plantarflexion 50 50  Ankle inversion 35 35  Ankle eversion 15 15   (Blank rows = not  tested)        LOWER EXTREMITY MMT:   MMT Right eval Left eval  Hip flexion 5 5  Hip extension 5 5  Hip abduction 5 5  Hip adduction      Hip internal rotation 5 5  Hip external rotation 5 5  Knee flexion 5 5  Knee extension 5 5  Ankle dorsiflexion 5 5  Ankle plantarflexion 5 5  Ankle inversion      Ankle eversion       (Blank rows = not tested)   LOWER EXTREMITY SPECIAL TESTS:  Hip special tests: Luisa Hart (FABER) test: negative, Thomas test: negative, Ober's test: negative, Craig's test: negative, and Ely's test: negative   FUNCTIONAL TESTS:  None performed    GAIT: Distance walked: 40 ft  Assistive device utilized: None Level of assistance: Complete Independence Comments: No gait abnormalities noted        TODAY'S TREATMENT:  05/13/22:         UBE level 3 with seat level 9 for 5 min          Seated Rhomboid Stretch 3 x 30 sec          Levator Scap Stretch 3 x  30 sec          Upper Trap Stretch 3 x 30 sec          Quadruped Shoulder T's #5 DB 3 x 10          Prone T's #5 DB 3 x 10          Prone Y's #5 DB 3 x 10                   Discussed heating pad options: [email or web address]   04/29/22:           Lower Trap MMT 4/4           Mid Trap MMT 5/5            Serratus MMT 4/4  D2 PNF Pattern with Green TB 3 x 10             Wall Push Up with Plus 3 x 10             Omega Face Pulls #5 3 x 10             -min VC to decrease thoracic extension            Seated Rows #5 3 x 10             Decreased left cervical side bend compared to right   Initial: Levator Scapulae Stretch 2 x 30 sec  Upper Trap Stretch 2 x 30 sec        PATIENT EDUCATION:  Education details: form and technique for appropriate exercise  Person educated: Patient and Spouse Education method: Programmer, multimedia, Facilities manager, Verbal cues, and Handouts Education comprehension: verbalized understanding, returned demonstration, and verbal cues required      HOME EXERCISE PROGRAM: Access Code: [email or web address] URL: [email or web address] Date: 05/13/2022 Prepared by: Ellin Goodie  Exercises - Seated Upper Trapezius Stretch  - 1 x daily - 7 x weekly - 1 sets - 3 reps - 30 hold - Seated Levator Scapulae Stretch  - 1 x daily - 7 x weekly - 1 sets - 3 reps - 30 hold - Face Pulls  - 1 x daily - 3 x weekly - 3 sets - 10 reps - Wall Push Up with Plus  - 1 x daily - 3 x weekly - 3 sets - 10 reps - Seated Row Cable Machine  - 1 x daily - 3 x weekly - 3 sets - 10 reps - Seated Rhomboid Stretch  - 1 x daily - 7 x weekly - 1 sets - 3 reps - 30 hold - Prone Single Arm Shoulder Y  - 1 x daily - 3 x weekly - 3 sets - 10 reps - Prone Shoulder Horizontal Abduction  - 1 x daily - 3 x weekly - 3 sets - 10 reps   ASSESSMENT:   CLINICAL IMPRESSION:  Pt shows improvement with parascapular strength with ability to perform progressed shoulder T's and I's in prone. Pt continues to show excellent motivation and follow through with completion of HEP. Next session to assess goals to determine pt progress and whether he has reached end of POC. He will continue to benefit from skilled PT to decrease cervical and thoracic pain to carry out lifting and bending required for job related tasks.    OBJECTIVE IMPAIRMENTS pain.    ACTIVITY LIMITATIONS lifting and bending   PARTICIPATION LIMITATIONS: occupation   PERSONAL FACTORS Profession are also affecting patient's functional outcome.  REHAB POTENTIAL: Excellent   CLINICAL DECISION MAKING: Stable/uncomplicated   EVALUATION COMPLEXITY: Low     GOALS: Goals reviewed with patient? No   SHORT TERM GOALS: Target date: 05/04/2022  Pt will be independent with HEP in order to improve strength and balance in order to decrease fall risk and improve function at home and work. Baseline: Completing exercises independently  Goal status: Ongoing      LONG TERM GOALS: Target date: 06/01/2022    Patient will have  improved function and activity level as evidenced by an increase in FOTO score by 7 points.   Baseline: 70  Goal status: Ongoing    2.  Patient will increase parascapular strength by >=1/2 MMT to offload mechanical forces on spine to be able to lift boxes for job with less pain.  Baseline: NT Goal status: Ongoing      PLAN: PT FREQUENCY: 1-2x/week   PT DURATION: 6 weeks   PLANNED INTERVENTIONS: Therapeutic exercises, Therapeutic activity, Balance training, Gait training, Patient/Family education, Joint manipulation, Joint mobilization, Aquatic Therapy, Dry Needling, Electrical stimulation, Cryotherapy, Moist heat, Manual therapy, and Re-evaluation   PLAN FOR NEXT SESSION: Reassess goals for monthly progress note. Progress parascapular strength, sitting posture and lifting posture    Ellin Goodie PT, DPT  05/13/2022, 8:54 AM

## 2022-05-17 ENCOUNTER — Encounter: Payer: BC Managed Care – PPO | Admitting: Physical Therapy

## 2022-05-19 ENCOUNTER — Encounter: Payer: BC Managed Care – PPO | Admitting: Physical Therapy

## 2022-05-20 ENCOUNTER — Ambulatory Visit: Payer: BC Managed Care – PPO | Admitting: Physical Therapy

## 2022-05-24 ENCOUNTER — Encounter: Payer: BC Managed Care – PPO | Admitting: Physical Therapy

## 2022-05-26 ENCOUNTER — Encounter: Payer: BC Managed Care – PPO | Admitting: Physical Therapy

## 2022-05-27 ENCOUNTER — Encounter: Payer: Self-pay | Admitting: Physical Therapy

## 2022-05-27 ENCOUNTER — Ambulatory Visit: Payer: BC Managed Care – PPO | Attending: Nurse Practitioner | Admitting: Physical Therapy

## 2022-05-27 DIAGNOSIS — M5459 Other low back pain: Secondary | ICD-10-CM | POA: Diagnosis not present

## 2022-05-27 NOTE — Therapy (Addendum)
OUTPATIENT PHYSICAL THERAPY TREATMENT NOTE/ Re-Certification    Patient Name: Andre ParodyDylan Well MRN: [id number] DOB:11/13/1995, 26 y.o., male Today's Date: 05/27/2022  PCP: Dr. Mordecai MaesJames Cable  REFERRING PROVIDER: Dr. Mordecai MaesJames Cable   END OF SESSION:   PT End of Session - 05/27/22 1720     Visit Number 4    Number of Visits 6    Date for PT Re-Evaluation 06/01/22    Authorization Type [employer]BS 60 PT/OT/ST    Authorization - Visit Number 4    Authorization - Number of Visits 60    Progress Note Due on Visit 10    PT Start Time 1715    PT Stop Time 1800    PT Time Calculation (min) 45 min    Activity Tolerance Patient tolerated treatment well    Behavior [facility] tasks assessed/performed             Past Medical History:  Diagnosis Date   History of colon polyps    Past Surgical History:  Procedure Laterality Date   polyp removed     under right arm pit   Patient Active Problem List   Diagnosis Date Noted   Chronic bilateral thoracic back pain 03/05/2022   Neck pain 03/05/2022   Preventative health care 01/06/2022   Need for Td vaccine 01/06/2022   History of colon polyps 01/06/2022    REFERRING DIAG: M54.6,G89.29 (ICD-10-CM) - Chronic bilateral thoracic back pain  THERAPY DIAG:  Other low back pain  Rationale for Evaluation and Treatment Rehabilitation  PERTINENT HISTORY:    Per Dr. Fayrene FearingJames note on 03/05/22    Chronic bilateral thoracic back pain                                   Likely musculature in nature given exam and patient's presentation.  Patient requested and wanted to proceed with x-rays today.  We will obtain thoracic and cervical views.  Patient can take Flexeril 5 mg twice daily as needed.  Sedation precaution reviewed with patient  PRECAUTIONS: None   SUBJECTIVE: Patient reports ongoing muscle soreness in his neck and shoulder blades and he thinks this more work related. 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 He has not yet ordered heating pad at this point, but plans on ordering it soon. He has used his wife's heating pad in the past, and he found relief using this.   PAIN:  Are you having pain? Yes: NPRS scale: 4-5/10 Pain location: C7 right side of spinous process  Pain description: Achy Aggravating factors: Flexing and extending head  Relieving factors: Not moving head    OBJECTIVE: (objective measures completed at initial evaluation unless otherwise dated)              VITALS: BP 110/66 SpO2 98 HR 70   DIAGNOSTIC FINDINGS: CLINICAL DATA:  Thoracic pain.   EXAM: THORACIC SPINE - 3 VIEWS   COMPARISON:  None Available.   FINDINGS: There is no evidence of thoracic spine fracture. Alignment is normal. No other significant bone abnormalities are identified.   IMPRESSION: Negative.     Electronically Signed   By: Darliss CheneyAmy  Guttmann M.D.   On: 03/06/2022 23:13     CLINICAL DATA:  Intermittent cervicalgia.   EXAM: CERVICAL SPINE - COMPLETE 4+ VIEW   COMPARISON:  None Available.  FINDINGS: There is no radiographic evidence of cervical spine fracture or prevertebral soft tissue swelling. There is a slight cervical kyphosis without evidence of listhesis. No other significant bone abnormalities are identified. Lung apices are clear.   IMPRESSION: Slight cervical kyphosis. No evidence of fracture, listhesis or degenerative change.     Electronically Signed   By: Almira Bar M.D.   On: 03/06/2022 23:15   PATIENT SURVEYS:  FOTO 70/77   COGNITION:           Overall cognitive status: Within functional limits for tasks assessed                          SENSATION: WFL   EDEMA: N/a   MUSCLE LENGTH: Hamstrings: Right 90 deg; Left 90 deg Thomas test: Negative bilateral    POSTURE: rounded shoulders   PALPATION: C7 spinous process and thoracic paraspinals                           CERVICAL:                                            AROM   PROM          NORM               flex:    50*         50*                         50                     ext:   60         60                          60            AROM R/L      PROM                            NORM     SB:       45/45                                                          45  rot:     70/70                                                              70-90                                  R      L    Normal   - shoulder flx     180 180  180  - shoulder abd    180 180         180  - shoulder ext     60    60             60  - combined mvt      (touch pt)         - ER       Occiput  Occiput  Occiput         - IR            T7        T7            T7 ER @ 90 deg abd   110 110            90 IR @ 90 deg abd    70   70              70       STRENGTH    Strength R/L 5/5 Shoulder flexion (anterior deltoid/pec major/coracobrachialis, axillary n. (C5/6) and musculocutaneous n. (C5-7)) 5/5 Shoulder abduction (deltoid/supraspinatus, axillary/suprascapular n, C5) 5/5 Shoulder external rotation (infraspinatus/teres minor) 5/5 Shoulder internal rotation (subcapularis/lats/pec major) 5/5 Shoulder extension (posterior deltoid, lats, teres major, axillary/thoracodorsal n.) 5/5 Shoulder horizontal abduction 5/5 Elbow flexion (biceps brachii, brachialis, brachioradialis, musculoskeletal n, C5/6) 5/5 Elbow extension (triceps, radial n, C7) 5/5 Wrist Extension (C6/7) 5/5 Wrist Flexion (C6/7) 5/5 Finger adduction (interossei, ulnar n, T1) Cervical isometrics are strong in all directions;   Parascapular Strength   Initial:   5/5 Mid Trap  4/4  Lower Trap  4/4  Serratus   05/27/22: 4+/4+ Lower Trap  4+/4+ Serratus     Lumbar                 Flexion: 100%              Extension: 100%             Side Bend R/L 100% 100%             Rotation R/L* 100%   100%     Active  Right 04/29/2022 Left 04/29/2022  Hip flexion 120 120  Hip extension 30 30   Hip abduction 45 45  Hip adduction 30 30  Hip internal rotation 45 45  Hip external rotation 45 45  Knee flexion 135 135  Knee extension 0 0  Ankle dorsiflexion 20 20  Ankle plantarflexion 50 50  Ankle inversion 35 35  Ankle eversion 15 15   (Blank rows = not tested)        LOWER EXTREMITY MMT:   MMT Right eval Left eval  Hip flexion 5 5  Hip extension 5 5  Hip abduction 5 5  Hip adduction      Hip internal rotation 5 5  Hip external rotation 5 5  Knee flexion 5 5  Knee extension 5 5  Ankle dorsiflexion 5 5  Ankle plantarflexion 5 5  Ankle inversion      Ankle eversion       (Blank rows = not tested)   LOWER EXTREMITY SPECIAL TESTS:  Hip special tests: Luisa Hart (FABER) test: negative, Thomas test: negative, Ober's test: negative, Craig's test: negative, and Ely's test: negative   FUNCTIONAL TESTS:  None performed    GAIT: Distance walked: 40 ft  Assistive device utilized: None Level of assistance: Complete Independence Comments: No gait abnormalities noted  TODAY'S TREATMENT:  05/27/22:      UBE Resistance 3 for 5 min      MMT: Serratus and Lower Trap 4+/4+       Omega Seated Rows #85 3 x 8      Omega Lat Pulldowns #65 3 x 8      Omega Face Pulls #25 3 x 8     Face Pulls with Blue TB 1 x 8     Face Pulls with Black TB 1 x 8                 Showed pt how to use theracane for myofacial release for trigger points in parascapular muscles   05/13/22:         UBE level 3 with seat level 9 for 5 min          Seated Rhomboid Stretch 3 x 30 sec          Levator Scap Stretch 3 x 30 sec          Upper Trap Stretch 3 x 30 sec          Quadruped Shoulder T's #5 DB 3 x 10          Prone T's #5 DB 3 x 10          Prone Y's #5 DB 3 x 10                   Discussed heating pad options: [email or web address]   04/29/22:           Lower Trap MMT 4/4           Mid Trap MMT 5/5            Serratus MMT 4/4  D2 PNF Pattern  with Green TB 3 x 10             Wall Push Up with Plus 3 x 10             Omega Face Pulls #5 3 x 10             -min VC to decrease thoracic extension            Seated Rows #5 3 x 10             Decreased left cervical side bend compared to right   Initial: Levator Scapulae Stretch 2 x 30 sec  Upper Trap Stretch 2 x 30 sec        PATIENT EDUCATION:  Education details: form and technique for appropriate exercise  Person educated: Patient and Spouse Education method: Programmer, multimedia, Facilities manager, Verbal cues, and Handouts Education comprehension: verbalized understanding, returned demonstration, and verbal cues required     HOME EXERCISE PROGRAM: Access Code: [email or web address] URL: [email or web address] Date: 05/27/2022 Prepared by: Ellin Goodie  Exercises - Seated Upper Trapezius Stretch  - 1 x daily - 7 x weekly - 1 sets - 3 reps - 30 hold - Seated Rhomboid Stretch  - 1 x daily - 7 x weekly - 1 sets - 3 reps - 30 hold - Seated Levator Scapulae Stretch  - 1 x daily - 7 x weekly - 1 sets - 3 reps - 30 hold - Face Pulls  - 1 x daily - 3 x weekly - 3 sets - 8 reps - Wall Push Up with Plus  - 1 x daily - 3 x weekly - 3 sets - 10 reps - Seated [employer]  Machine  - 1 x daily - 3 x weekly - 3 sets - 8 reps - Prone Single Arm Shoulder Y  - 1 x daily - 3 x weekly - 3 sets - 10 reps - Prone Shoulder Horizontal Abduction  - 1 x daily - 3 x weekly - 3 sets - 10 reps - Seated Cervical Retraction  - 1 x daily - 3 x weekly - 3 sets - 10 reps   ASSESSMENT:   CLINICAL IMPRESSION: Pt exhibits improved parascapular strength despite ongoing neck pain. 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 Exercises progressed to include increased resistance. He will continue to benefit from skilled PT to decrease cervical and thoracic pain to carry out lifting and bending required for job related tasks.  If pt does not show improvement in pain by next session, then he will likely need further evaluation and treatment by medical team.    OBJECTIVE IMPAIRMENTS pain.    ACTIVITY LIMITATIONS lifting and bending   PARTICIPATION LIMITATIONS: occupation   PERSONAL FACTORS Profession are also affecting patient's functional outcome.    REHAB POTENTIAL: Excellent   CLINICAL DECISION MAKING: Stable/uncomplicated   EVALUATION COMPLEXITY: Low     GOALS: Goals reviewed with patient? No   SHORT TERM GOALS: Target date: 05/04/2022  Pt will be independent with HEP in order to improve strength and balance in order to decrease fall risk and improve function at home and work. Baseline: Completing exercises independently  Goal status: Ongoing      LONG TERM GOALS: Target date: 06/01/2022    Patient will have improved function and activity level as evidenced by an increase in FOTO score by 7 points.   Baseline: 70 05/27/22: 94 Goal status: Achieved    2.  Patient will increase parascapular strength by >=1/2 MMT to offload mechanical forces on spine to be able to lift boxes for job with less pain.  Baseline: Lower Trap, Serratus, Mid Trap 4/4 05/27/22: Lower Trap + Serratus 4+/4+, Mid Trap 5/5   Goal status: Achieved      PLAN: PT FREQUENCY: 1-2x/week   PT DURATION: 6 weeks   PLANNED INTERVENTIONS: Therapeutic exercises, Therapeutic activity, Balance training, Gait training, Patient/Family education, Joint manipulation, Joint mobilization, Aquatic Therapy, Dry Needling, Electrical stimulation, Cryotherapy, Moist heat, Manual therapy, and Re-evaluation   PLAN FOR NEXT SESSION: Reassess pain and progress HEP accordingly    Ellin Goodie PT, DPT  05/27/2022, 6:06 PM

## 2022-05-31 ENCOUNTER — Encounter: Payer: BC Managed Care – PPO | Admitting: Physical Therapy

## 2022-06-02 ENCOUNTER — Encounter: Payer: BC Managed Care – PPO | Admitting: Physical Therapy

## 2022-06-03 ENCOUNTER — Ambulatory Visit: Payer: BC Managed Care – PPO | Admitting: Physical Therapy

## 2022-06-07 ENCOUNTER — Encounter: Payer: BC Managed Care – PPO | Admitting: Physical Therapy

## 2022-06-09 ENCOUNTER — Encounter: Payer: BC Managed Care – PPO | Admitting: Physical Therapy

## 2022-06-10 ENCOUNTER — Telehealth: Payer: Self-pay | Admitting: Physical Therapy

## 2022-06-10 ENCOUNTER — Ambulatory Visit: Payer: BC Managed Care – PPO | Admitting: Physical Therapy

## 2022-06-10 NOTE — Telephone Encounter (Signed)
Attempted to call patient about appointment, but VM full so unable to leave message. This will be considered a no show.

## 2022-06-14 ENCOUNTER — Encounter: Payer: BC Managed Care – PPO | Admitting: Physical Therapy

## 2022-06-16 ENCOUNTER — Encounter: Payer: BC Managed Care – PPO | Admitting: Physical Therapy

## 2022-06-17 ENCOUNTER — Encounter: Payer: Self-pay | Admitting: Physical Therapy

## 2022-06-17 ENCOUNTER — Ambulatory Visit: Payer: BC Managed Care – PPO | Admitting: Physical Therapy

## 2022-06-17 DIAGNOSIS — M5459 Other low back pain: Secondary | ICD-10-CM

## 2022-06-17 NOTE — Therapy (Addendum)
OUTPATIENT PHYSICAL THERAPY TREATMENT NOTE/ Discharge Summary    Patient Name: Andre Fisher MRN: [id number] DOB:[birth year], 26 y.o., male Today's Date: 06/17/2022  PCP: Dr. Mordecai Maes  REFERRING PROVIDER: Dr. Mordecai Maes   Discharge Summary: Pt has recently started a new job which requires significantly less rotation and use of his neck. He now feels much better and he no longer has neck pain.   END OF SESSION:   PT End of Session - 06/17/22 1155     Visit Number 5    Number of Visits 6    Date for PT Re-Evaluation 08/02/22    Authorization Type [employer] 60 PT/OT/ST    Authorization - Visit Number 5    Authorization - Number of Visits 60    Progress Note Due on Visit 10    PT Start Time 1150    PT Stop Time 1230    PT Time Calculation (min) 40 min    Activity Tolerance Patient tolerated treatment well    Behavior During Therapy WFL for tasks assessed/performed             Past Medical History:  Diagnosis Date   History of colon polyps    Past Surgical History:  Procedure Laterality Date   polyp removed     under right arm pit   Patient Active Problem List   Diagnosis Date Noted   Chronic bilateral thoracic back pain 03/05/2022   Neck pain 03/05/2022   Preventative health care 01/06/2022   Need for Td vaccine 01/06/2022   History of colon polyps 01/06/2022    REFERRING DIAG: M54.6,G89.29 (ICD-10-CM) - Chronic bilateral thoracic back pain  THERAPY DIAG:  Other low back pain  Rationale for Evaluation and Treatment Rehabilitation  PERTINENT HISTORY:    Per Dr. Fayrene Fearing note on 03/05/22    Chronic bilateral thoracic back pain                                   Likely musculature in nature given exam and patient's presentation.  Patient requested and wanted to proceed with x-rays today.  We will obtain thoracic and cervical views.  Patient can take Flexeril 5 mg twice daily as needed.  Sedation precaution reviewed with patient  PRECAUTIONS: None   SUBJECTIVE: Pt  reports decreased neck soreness since last visit. 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 He has been able to do all exercises without much difficulty.   PAIN:  Are you having pain? No   OBJECTIVE: (objective measures completed at initial evaluation unless otherwise dated)              VITALS: BP 110/66 SpO2 98 HR 70   DIAGNOSTIC FINDINGS: CLINICAL DATA:  Thoracic pain.   EXAM: THORACIC SPINE - 3 VIEWS   COMPARISON:  None Available.   FINDINGS: There is no evidence of thoracic spine fracture. Alignment is normal. No other significant bone abnormalities are identified.   IMPRESSION: Negative.     Electronically Signed   By: Darliss Cheney M.D.   On: 03/06/2022 23:13     CLINICAL DATA:  Intermittent cervicalgia.   EXAM: CERVICAL SPINE - COMPLETE 4+ VIEW   COMPARISON:  None Available.   FINDINGS: There is no radiographic evidence of cervical spine fracture or prevertebral soft tissue swelling. There is a slight cervical  kyphosis without evidence of listhesis. No other significant bone abnormalities are identified. Lung apices are clear.   IMPRESSION: Slight cervical kyphosis. No evidence of fracture, listhesis or degenerative change.     Electronically Signed   By: Almira BarKeith  Chesser M.D.   On: 03/06/2022 23:15   PATIENT SURVEYS:  FOTO 70/77   COGNITION:           Overall cognitive status: Within functional limits for tasks assessed                          SENSATION: WFL   EDEMA: N/a   MUSCLE LENGTH: Hamstrings: Right 90 deg; Left 90 deg Thomas test: Negative bilateral    POSTURE: rounded shoulders   PALPATION: C7 spinous process and thoracic paraspinals                           CERVICAL:                                           AROM   PROM          NORM               flex:    50*         50*                         50                     ext:   60         60                           60            AROM R/L      PROM                            NORM     SB:       45/45                                                          45  rot:     70/70                                                              70-90                                  R      L    Normal   - shoulder flx     180 180          180  - shoulder abd    180 180  180  - shoulder ext     60    60             60  - combined mvt      (touch pt)         - ER       Occiput  Occiput  Occiput         - IR            T7        T7            T7 ER @ 90 deg abd   110 110            90 IR @ 90 deg abd    70   70              70       STRENGTH    Strength R/L 5/5 Shoulder flexion (anterior deltoid/pec major/coracobrachialis, axillary n. (C5/6) and musculocutaneous n. (C5-7)) 5/5 Shoulder abduction (deltoid/supraspinatus, axillary/suprascapular n, C5) 5/5 Shoulder external rotation (infraspinatus/teres minor) 5/5 Shoulder internal rotation (subcapularis/lats/pec major) 5/5 Shoulder extension (posterior deltoid, lats, teres major, axillary/thoracodorsal n.) 5/5 Shoulder horizontal abduction 5/5 Elbow flexion (biceps brachii, brachialis, brachioradialis, musculoskeletal n, C5/6) 5/5 Elbow extension (triceps, radial n, C7) 5/5 Wrist Extension (C6/7) 5/5 Wrist Flexion (C6/7) 5/5 Finger adduction (interossei, ulnar n, T1) Cervical isometrics are strong in all directions;   Parascapular Strength   Initial:   5/5 Mid Trap  4/4  Lower Trap  4/4  Serratus   05/27/22: 4+/4+ Lower Trap  4+/4+ Serratus     Lumbar                 Flexion: 100%              Extension: 100%             Side Bend R/L 100% 100%             Rotation R/L* 100%   100%     Active  Right 04/29/2022 Left 04/29/2022  Hip flexion 120 120  Hip extension 30 30  Hip abduction 45 45  Hip adduction 30 30  Hip internal rotation 45 45  Hip external rotation 45 45  Knee flexion 135 135  Knee extension 0  0  Ankle dorsiflexion 20 20  Ankle plantarflexion 50 50  Ankle inversion 35 35  Ankle eversion 15 15   (Blank rows = not tested)        LOWER EXTREMITY MMT:   MMT Right eval Left eval  Hip flexion 5 5  Hip extension 5 5  Hip abduction 5 5  Hip adduction      Hip internal rotation 5 5  Hip external rotation 5 5  Knee flexion 5 5  Knee extension 5 5  Ankle dorsiflexion 5 5  Ankle plantarflexion 5 5  Ankle inversion      Ankle eversion       (Blank rows = not tested)   LOWER EXTREMITY SPECIAL TESTS:  Hip special tests: Luisa Hart (FABER) test: negative, Thomas test: negative, Ober's test: negative, Craig's test: negative, and Ely's test: negative   FUNCTIONAL TESTS:  None performed    GAIT: Distance walked: 40 ft  Assistive device utilized: None Level of assistance: Complete Independence Comments: No gait abnormalities noted        TODAY'S TREATMENT:  06/17/22 UBE Resistance 3 for 5 min  Face Pulls 3 x 8  Gray TB Serratus Push Ups 3 x 10  Lat Pull Downs with 3 x 10 Gray TB  Bent Over Row 3 x 10 Wallace Cullens TB    05/27/22:      UBE Resistance 3 for 5 min      MMT: Serratus and Lower Trap 4+/4+       Omega Seated Rows #85 3 x 8      Omega Lat Pulldowns #65 3 x 8      Omega Face Pulls #25 3 x 8     Face Pulls with Blue TB 1 x 8     Face Pulls with Black TB 1 x 8                 Showed pt how to use theracane for myofacial release for trigger points in parascapular muscles   05/13/22:         UBE level 3 with seat level 9 for 5 min          Seated Rhomboid Stretch 3 x 30 sec          Levator Scap Stretch 3 x 30 sec          Upper Trap Stretch 3 x 30 sec          Quadruped Shoulder T's #5 DB 3 x 10          Prone T's #5 DB 3 x 10          Prone Y's #5 DB 3 x 10                   Discussed heating pad options: [email or web address]   04/29/22:           Lower Trap MMT 4/4           Mid Trap MMT 5/5            Serratus MMT  4/4  D2 PNF Pattern with Green TB 3 x 10             Wall Push Up with Plus 3 x 10             Omega Face Pulls #5 3 x 10             -min VC to decrease thoracic extension            Seated Rows #5 3 x 10             Decreased left cervical side bend compared to right          PATIENT EDUCATION:  Education details: form and technique for appropriate exercise  Person educated: Patient and Spouse Education method: Explanation, Demonstration, Verbal cues, and Handouts Education comprehension: verbalized understanding, returned demonstration, and verbal cues required     HOME EXERCISE PROGRAM: Access Code: [email or web address] URL: [email or web address] Date: 06/17/2022 Prepared by: Ellin Goodie  Exercises - Seated Upper Trapezius Stretch  - 1 x daily - 7 x weekly - 1 sets - 3 reps - 30 hold - Seated Rhomboid Stretch  - 1 x daily - 7 x weekly - 1 sets - 3 reps - 30 hold - Seated Levator Scapulae Stretch  - 1 x daily - 7 x weekly - 1 sets - 3 reps - 30 hold - Face Pulls  - 1 x daily - 3 x weekly - 3 sets - 8 reps - Wall Push Up with Plus  - 1 x daily - 3 x  weekly - 3 sets - 10 reps - Seated Row Cable Machine  - 1 x daily - 3 x weekly - 3 sets - 8 reps - Prone Single Arm Shoulder Y  - 1 x daily - 3 x weekly - 3 sets - 10 reps - Prone Shoulder Horizontal Abduction  - 1 x daily - 3 x weekly - 3 sets - 10 reps - Seated Cervical Retraction  - 1 x daily - 3 x weekly - 3 sets - 10 reps - Prone Scapular Protraction Retraction AROM on Forearms  - 3 x weekly - 3 sets - 10 reps - Standing Bent Over Low Shoulder Row with Anchored Resistance  - 3 x weekly - 3 sets - 10 reps   ASSESSMENT:   CLINICAL IMPRESSION:  Pt continues to show improvement in neck pain and parascapular strength. He has since quit job and this is likely decreasing his cervical activity. Pt able to perform progressed parascapular exercises with increased resistance. He will continue to benefit from skilled PT to  decrease cervical and thoracic pain to carry out lifting and bending required for job related tasks. If pt does not show improvement in pain by next session, then he will likely need further evaluation and treatment by medical team.    OBJECTIVE IMPAIRMENTS pain.    ACTIVITY LIMITATIONS lifting and bending   PARTICIPATION LIMITATIONS: occupation   PERSONAL FACTORS Profession are also affecting patient's functional outcome.    REHAB POTENTIAL: Excellent   CLINICAL DECISION MAKING: Stable/uncomplicated   EVALUATION COMPLEXITY: Low     GOALS: Goals reviewed with patient? No   SHORT TERM GOALS: Target date: 05/04/2022  Pt will be independent with HEP in order to improve strength and balance in order to decrease fall risk and improve function at home and work. Baseline: Completing exercises independently  Goal status: Achieved      LONG TERM GOALS: Target date: 06/01/2022    Patient will have improved function and activity level as evidenced by an increase in FOTO score by 7 points.   Baseline: 70 05/27/22: 94 Goal status: Achieved    2.  Patient will increase parascapular strength by >=1/2 MMT to offload mechanical forces on spine to be able to lift boxes for job with less pain.  Baseline: Lower Trap, Serratus, Mid Trap 4/4 05/27/22: Lower Trap + Serratus 4+/4+, Mid Trap 5/5   Goal status: Achieved      PLAN: PT FREQUENCY: 1-2x/week   PT DURATION: 6 weeks   PLANNED INTERVENTIONS: Therapeutic exercises, Therapeutic activity, Balance training, Gait training, Patient/Family education, Joint manipulation, Joint mobilization, Aquatic Therapy, Dry Needling, Electrical stimulation, Cryotherapy, Moist heat, Manual therapy, and Re-evaluation   PLAN FOR NEXT SESSION: Discharge from PT   Ellin Goodie PT, DPT  06/17/2022, 11:56 AM

## 2022-06-24 ENCOUNTER — Ambulatory Visit: Payer: BC Managed Care – PPO | Attending: Nurse Practitioner

## 2022-06-24 NOTE — Addendum Note (Signed)
Addended by: Johnn Hai on: 06/24/2022 01:39 PM   Modules accepted: Orders

## 2022-07-01 ENCOUNTER — Encounter: Payer: BC Managed Care – PPO | Admitting: Physical Therapy

## 2022-07-01 ENCOUNTER — Telehealth: Payer: Self-pay | Admitting: Physical Therapy

## 2022-07-01 NOTE — Telephone Encounter (Signed)
Attempted to call pt to inform him that he would be discharged given recent progress and decrease in neck and low back pain.

## 2022-09-29 IMAGING — DX DG CERVICAL SPINE COMPLETE 4+V
6 series · 6 of 6 positions shown · non-contrast
Comparison: None Available.

CLINICAL DATA: Intermittent cervicalgia.

EXAM:
CERVICAL SPINE - COMPLETE 4+ VIEW

[cervical spine ap]
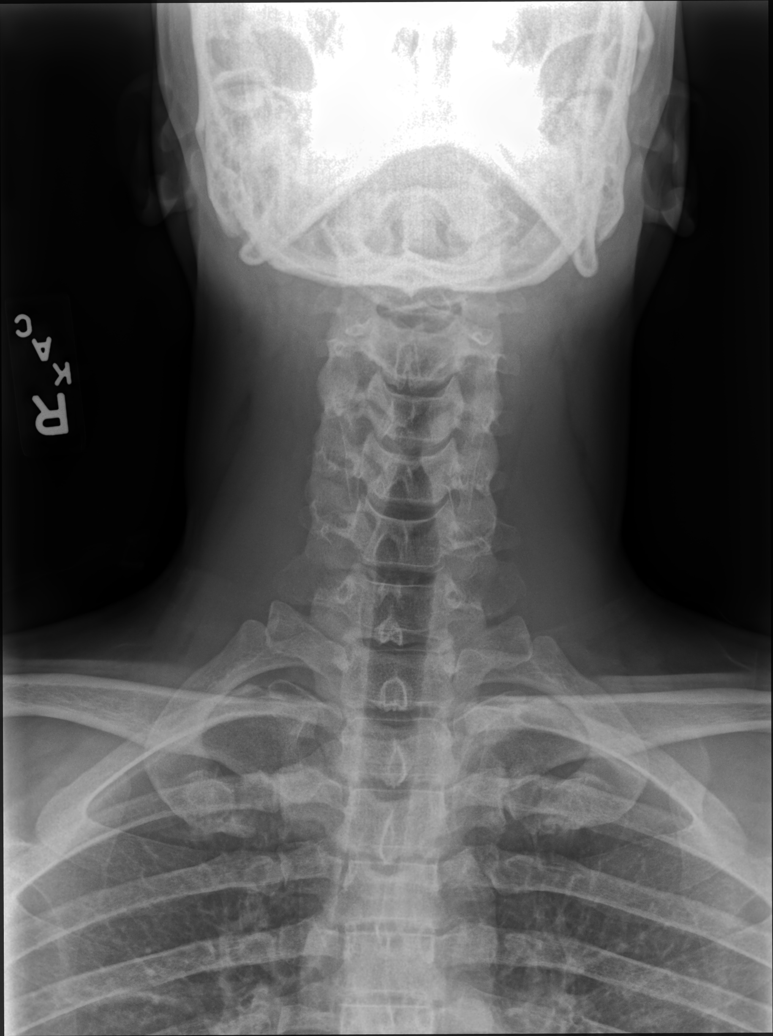

[cervical spine ap open mouth]
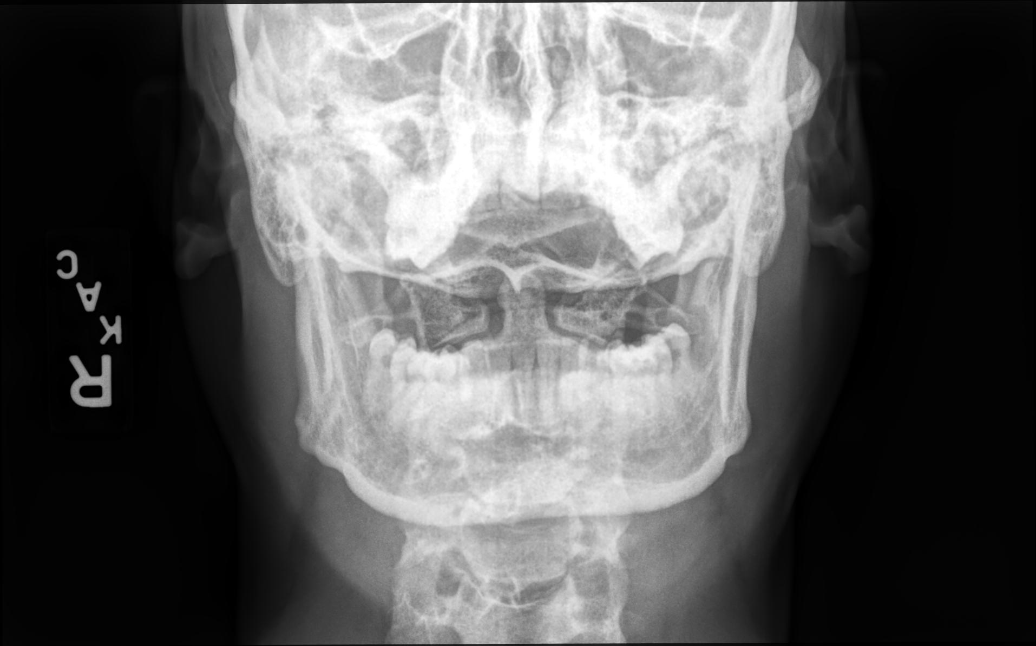

[cervical spine (swimmers) lat]
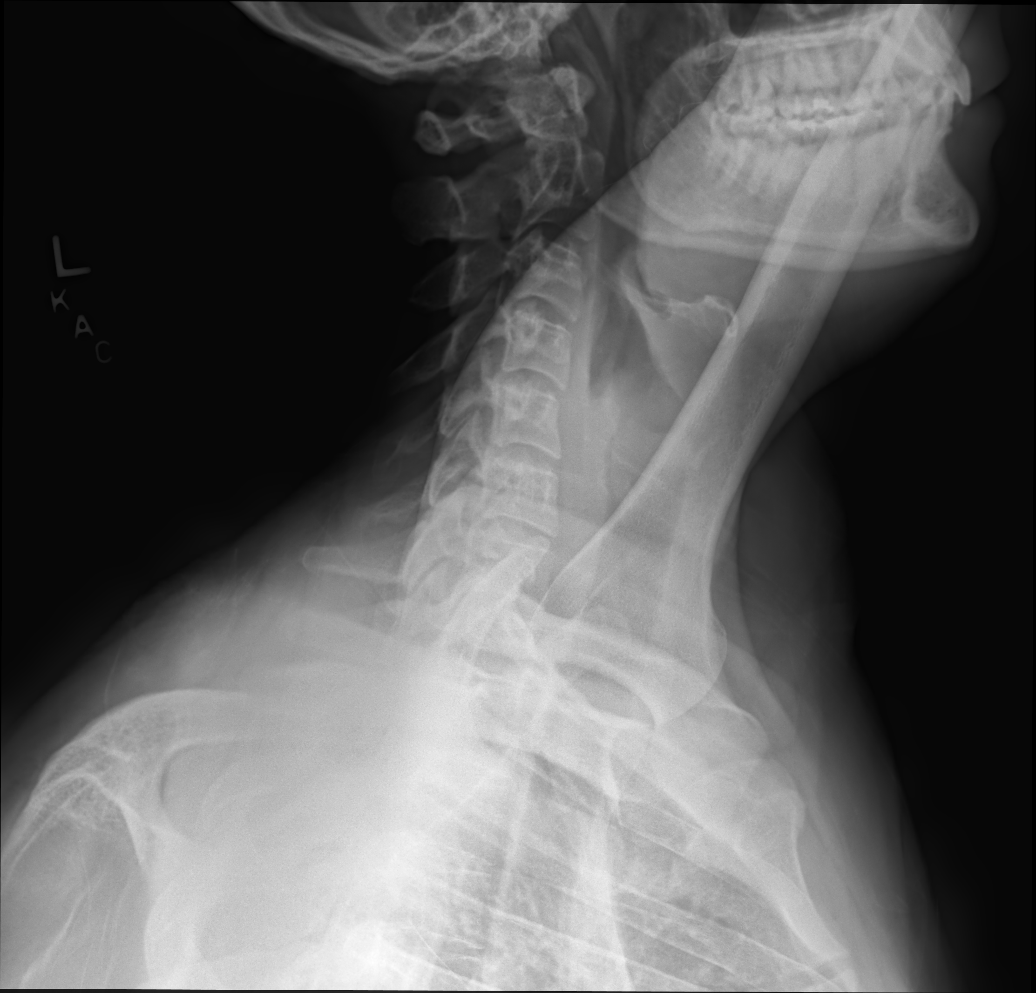

[cervical spine lat]
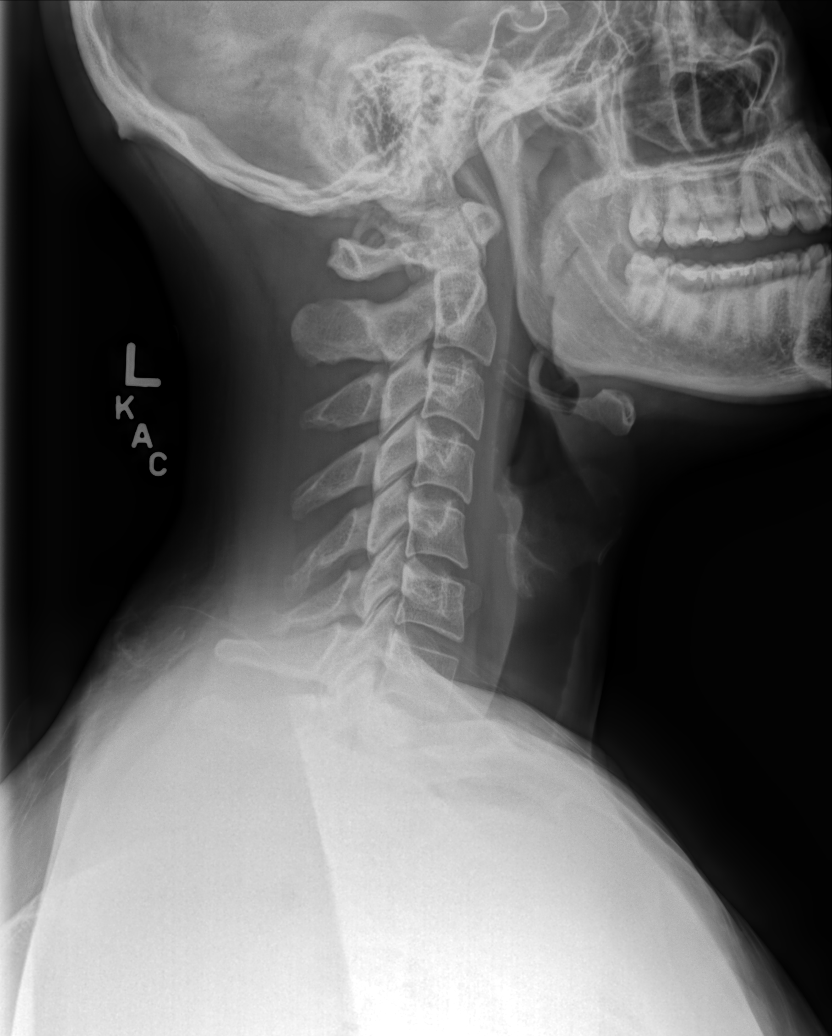

[cervical spine oblique lmo (1 of 2)]
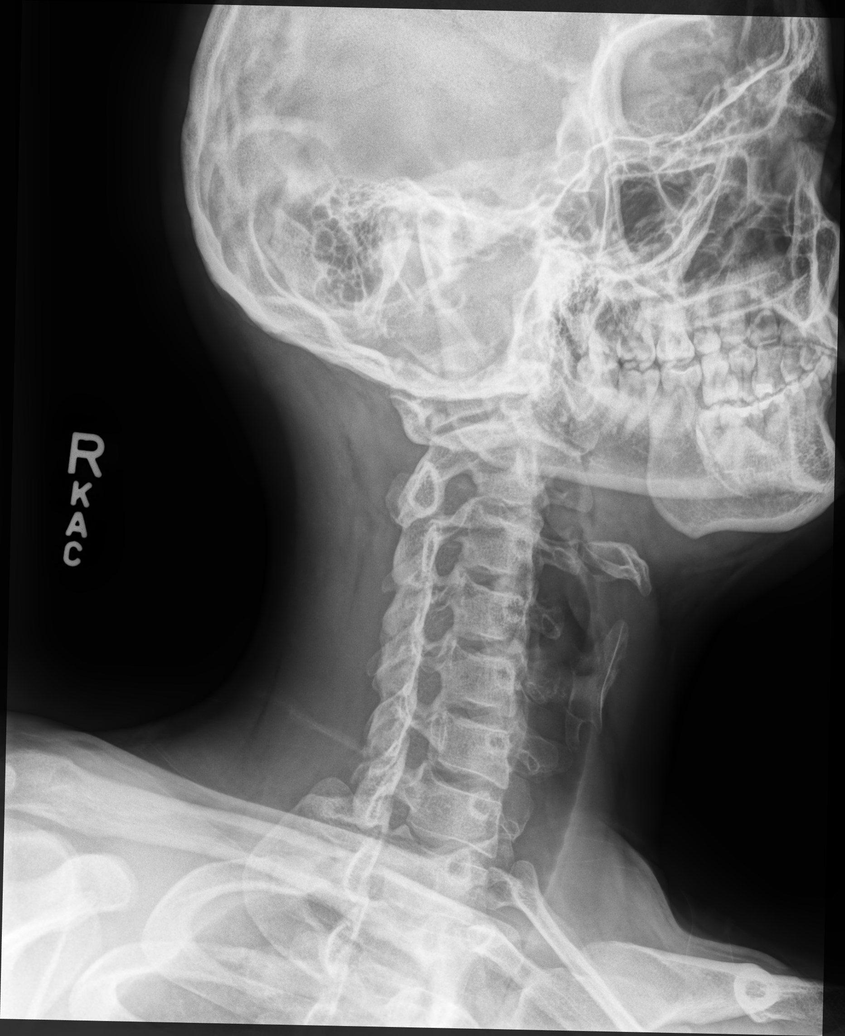

[cervical spine oblique lmo (2 of 2)]
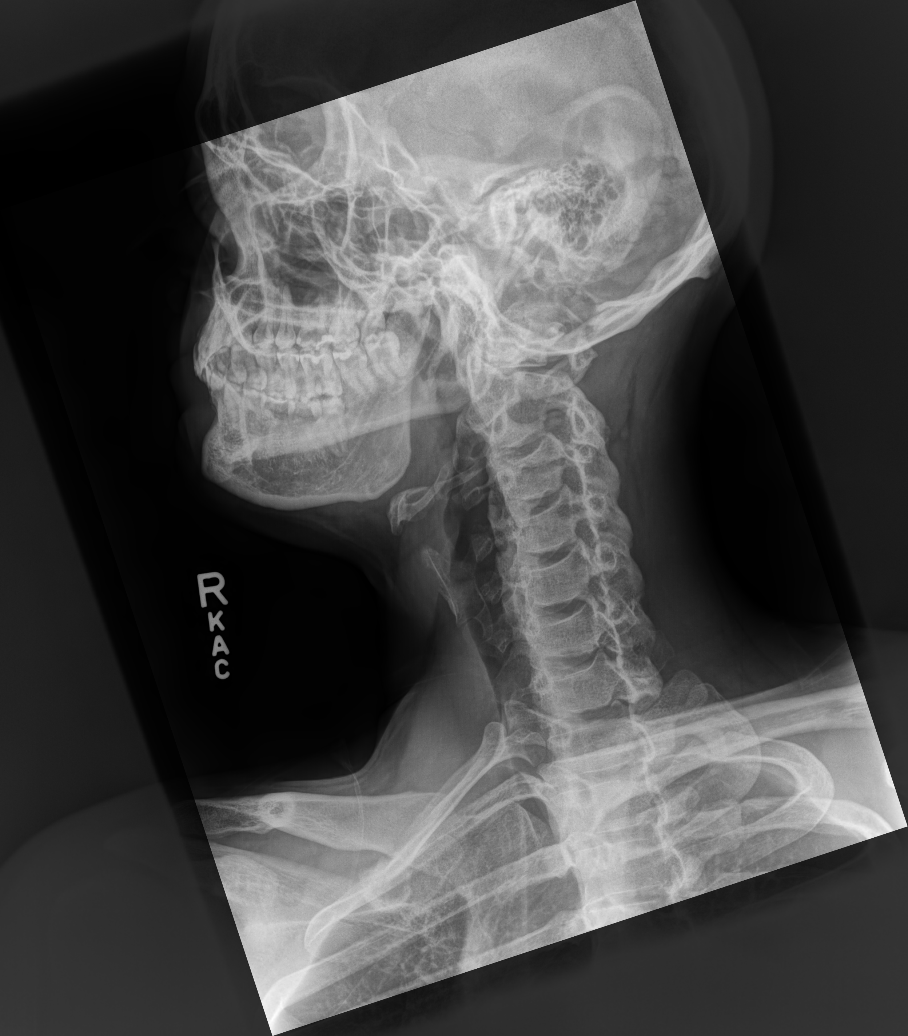

[6 of 6 positions shown; findings below may reference images not displayed]

FINDINGS: There is no radiographic evidence of cervical spine fracture or
prevertebral soft tissue swelling. There is a slight cervical
kyphosis without evidence of listhesis. No other significant bone
abnormalities are identified. Lung apices are clear.
IMPRESSION: Slight cervical kyphosis. No evidence of fracture, listhesis or
degenerative change.

## 2023-04-19 DIAGNOSIS — Z3141 Encounter for fertility testing: Secondary | ICD-10-CM | POA: Diagnosis not present

## 2023-08-12 DIAGNOSIS — Z31440 Encounter of male for testing for genetic disease carrier status for procreative management: Secondary | ICD-10-CM | POA: Diagnosis not present

## 2023-11-11 DIAGNOSIS — Z113 Encounter for screening for infections with a predominantly sexual mode of transmission: Secondary | ICD-10-CM | POA: Diagnosis not present

## 2024-01-04 DIAGNOSIS — Z3189 Encounter for other procreative management: Secondary | ICD-10-CM | POA: Diagnosis not present

## 2024-04-22 ENCOUNTER — Encounter: Payer: Self-pay | Admitting: Emergency Medicine

## 2024-04-22 ENCOUNTER — Ambulatory Visit: Admission: EM | Admit: 2024-04-22 | Discharge: 2024-04-22 | Disposition: A | Discharge status: Home / Self Care

## 2024-04-22 DIAGNOSIS — H60392 Other infective otitis externa, left ear: Secondary | ICD-10-CM | POA: Diagnosis not present

## 2024-04-22 MED ORDER — CIPROFLOXACIN-DEXAMETHASONE 0.3-0.1 % OT SUSP: 4.0000 [drp] | Freq: Two times a day (BID) | OTIC | 0 refills | Status: DC

## 2024-04-22 NOTE — ED Triage Notes (Signed)
 Patient complains of left ear pain x 1 week. Rates pain 8/10. Has taken Tylenol and OTC ear drops.

## 2024-04-22 NOTE — Discharge Instructions (Addendum)
 1. Infective otitis externa of left ear (Primary) - ciprofloxacin -dexamethasone  (CIPRODEX ) OTIC suspension; Place 4 drops into the left ear 2 (two) times daily for treatment of otitis externa - Aerobic Culture w Gram Stain (superficial specimen) collected in UC and sent to lab for further testing results should be available in 2 to 3 days.  If there is any abnormal findings you will be contacted and appropriate treatment provided -Take ibuprofen 600 to 800 mg every 6-8 hours as needed for pain and inflammation secondary to otitis externa.  Otitis externa, also known as swimmer's ear, is an infection or inflammation of the ear canal, the passage that leads from the outside of the ear to the eardrum. This condition is common, especially in children and young adults, and often happens after swimming or getting water in the ears.[1][8][6] What causes otitis externa?  Most cases are caused by bacteria, especially Pseudomonas aeruginosa and Staphylococcus aureus.[1][8]  Water trapped in the ear canal, scratching or cleaning the ear with objects (like cotton swabs), or skin conditions can increase the risk.[1][3][6] What are the symptoms?  Ear pain, which may be severe and get worse when the ear is pulled or pressed.[1][3]  Redness and swelling of the ear canal.  Itching inside the ear.  Drainage (fluid or pus) from the ear.  Sometimes, trouble hearing if the canal is swollen shut.[1][3][6] How is it treated?  Topical ear drops are the main treatment. These may contain antibiotics, steroids, or both. They help fight infection and reduce swelling and pain.[1][8][6-8]  Common options include acetic acid, aminoglycosides, polymyxin B, and quinolones (like ciprofloxacin  or ofloxacin), sometimes combined with a steroid.[1][8][5][9]  There is no strong evidence that one type of ear drop is better than another for most people. The choice depends on cost, allergies, and whether the eardrum is  intact.[1][3-5]  Drops are usually used for 7 to 10 days.[1-3][5][9]  If the ear canal is very swollen, a small sponge (wick) may be placed in the ear to help the drops reach deeper.[2-3]  Pain relief is important. Over-the-counter pain medicines like acetaminophen or ibuprofen can help.[1-3]  Oral antibiotics are rarely needed. They are only used if the infection has spread beyond the ear canal or in people with certain health problems (like diabetes or a weak immune system).[1-3][6] How to use ear drops:  Warm the bottle in your hands before use.  Lie on your side with the affected ear up.  Pull the ear gently back and up (for children, back and down).  Place the recommended number of drops in the ear.  Stay in position for at least 1 minute to let the drops soak in.[2][9] What should be avoided during treatment?  Do not get water in the ear while it is healing. Use a shower cap or cotton ball coated with petroleum jelly when bathing.[2-3]  Avoid swimming until the infection is gone (usually 7-10 days).[2]  Do not use cotton swabs, hairpins, or other objects to clean the ear.[1-3] How can otitis externa be prevented?  Keep ears dry. Dry ears gently after swimming or bathing; a hair dryer on the lowest setting can help.[1-2]  Use earplugs when swimming if prone to infections.[2]  Avoid putting objects in the ear canal.  Do not use ear drops unless recommended by a healthcare provider.[1-2] When to seek medical help:  If symptoms do not improve after a few days of treatment.  If there is severe pain, fever, or swelling spreading beyond the ear.  If  you have diabetes, a weakened immune system, or other serious health conditions and develop ear symptoms.[1-3][6] Most people recover fully with proper treatment. Preventing moisture and trauma to the ear canal is the best way to avoid future infections.[1-2][6]

## 2024-04-22 NOTE — ED Provider Notes (Signed)
 [facility]  Note:  This document was prepared using Conservation officer, historic buildings and may include unintentional dictation errors.  MRN: [id number] DOB: [birth year]  Subjective:   Andre Fisher is a 28 y.o. male presenting for left ear pain x 1 week.  Patient reports that pain is 8/10.  Patient is been using over-the-counter Tylenol and OTC eardrops with minimal improvement.  Patient reports purulent drainage from left ear usually at night on his pillowcase.  Patient states that he went swimming last week prior to the onset of symptoms and believes he may have swimmer's ear.  Patient does admit to reduced hearing out of the left ear possibly due to purulent discharge.  No current facility-administered medications for this encounter.  Current Outpatient Medications:    ciprofloxacin -dexamethasone  (CIPRODEX ) OTIC suspension, Place 4 drops into the left ear 2 (two) times daily., Disp: 7.5 mL, Rfl: 0   cyclobenzaprine  (FLEXERIL ) 5 MG tablet, Take 1 tablet (5 mg total) by mouth 2 (two) times daily as needed for muscle spasms. (Patient not taking: Reported on 04/19/2022), Disp: 20 tablet, Rfl: 0   No Known Allergies  Past Medical History:  Diagnosis Date   History of colon polyps      Past Surgical History:  Procedure Laterality Date   polyp removed     under right arm pit    Family History  Problem Relation Age of Onset   Hyperlipidemia Mother    Heart disease Mother    COPD Maternal Grandmother    Stroke Maternal Grandmother    Heart disease Maternal Grandmother    Heart attack Maternal Grandmother    Cirrhosis Maternal Grandfather     Social History   Tobacco Use   Smoking status: Never   Smokeless tobacco: Never  Vaping Use   Vaping status: Never Used  Substance Use Topics   Alcohol use: No   Drug use: Never    ROS Refer to HPI for ROS details.  Objective:   Vitals: BP 117/77 (BP Location: Right Arm)   Pulse 85   Temp 98.8 F (37.1 C) (Oral)    Resp 18   SpO2 98%   Physical Exam Vitals and nursing note reviewed.  Constitutional:      General: He is not in acute distress.    Appearance: He is well-developed. He is not ill-appearing or toxic-appearing.  HENT:     Head: Normocephalic.     Right Ear: Tympanic membrane, ear canal and external ear normal.     Left Ear: External ear normal. Decreased hearing noted. Drainage, swelling and tenderness present.  Cardiovascular:     Rate and Rhythm: Normal rate.  Pulmonary:     Effort: Pulmonary effort is normal. No respiratory distress.  Skin:    General: Skin is warm and dry.  Neurological:     General: No focal deficit present.     Mental Status: He is alert and oriented to person, place, and time.  Psychiatric:        Mood and Affect: Mood normal.        Behavior: Behavior normal.     Procedures  No results found for this or any previous visit (from the past 24 hours).  No results found.   Assessment and Plan :     Discharge Instructions       1. Infective otitis externa of left ear (Primary) - ciprofloxacin -dexamethasone  (CIPRODEX ) OTIC suspension; Place 4 drops into the left ear 2 (two) times daily for treatment of otitis  externa - Aerobic Culture w Gram Stain (superficial specimen) collected in UC and sent to lab for further testing results should be available in 2 to 3 days.  If there is any abnormal findings you will be contacted and appropriate treatment provided -Take ibuprofen 600 to 800 mg every 6-8 hours as needed for pain and inflammation secondary to otitis externa.  Otitis externa, also known as swimmer's ear, is an infection or inflammation of the ear canal, the passage that leads from the outside of the ear to the eardrum. This condition is common, especially in children and young adults, and often happens after swimming or getting water in the ears.[1][8][6] What causes otitis externa?  Most cases are caused by bacteria, especially Pseudomonas  aeruginosa and Staphylococcus aureus.[1][8]  Water trapped in the ear canal, scratching or cleaning the ear with objects (like cotton swabs), or skin conditions can increase the risk.[1][3][6] What are the symptoms?  Ear pain, which may be severe and get worse when the ear is pulled or pressed.[1][3]  Redness and swelling of the ear canal.  Itching inside the ear.  Drainage (fluid or pus) from the ear.  Sometimes, trouble hearing if the canal is swollen shut.[1][3][6] How is it treated?  Topical ear drops are the main treatment. These may contain antibiotics, steroids, or both. They help fight infection and reduce swelling and pain.[1][8][6-8]  Common options include acetic acid, aminoglycosides, polymyxin B, and quinolones (like ciprofloxacin  or ofloxacin), sometimes combined with a steroid.[1][8][5][9]  There is no strong evidence that one type of ear drop is better than another for most people. The choice depends on cost, allergies, and whether the eardrum is intact.[1][3-5]  Drops are usually used for 7 to 10 days.[1-3][5][9]  If the ear canal is very swollen, a small sponge (wick) may be placed in the ear to help the drops reach deeper.[2-3]  Pain relief is important. Over-the-counter pain medicines like acetaminophen or ibuprofen can help.[1-3]  Oral antibiotics are rarely needed. They are only used if the infection has spread beyond the ear canal or in people with certain health problems (like diabetes or a weak immune system).[1-3][6] How to use ear drops:  Warm the bottle in your hands before use.  Lie on your side with the affected ear up.  Pull the ear gently back and up (for children, back and down).  Place the recommended number of drops in the ear.  Stay in position for at least 1 minute to let the drops soak in.[2][9] What should be avoided during treatment?  Do not get water in the ear while it is healing. Use a shower cap or cotton ball coated with petroleum jelly when  bathing.[2-3]  Avoid swimming until the infection is gone (usually 7-10 days).[2]  Do not use cotton swabs, hairpins, or other objects to clean the ear.[1-3] How can otitis externa be prevented?  Keep ears dry. Dry ears gently after swimming or bathing; a hair dryer on the lowest setting can help.[1-2]  Use earplugs when swimming if prone to infections.[2]  Avoid putting objects in the ear canal.  Do not use ear drops unless recommended by a healthcare provider.[1-2] When to seek medical help:  If symptoms do not improve after a few days of treatment.  If there is severe pain, fever, or swelling spreading beyond the ear.  If you have diabetes, a weakened immune system, or other serious health conditions and develop ear symptoms.[1-3][6] Most people recover fully with proper treatment. Preventing moisture and trauma  to the ear canal is the best way to avoid future infections.[1-2][6]      Eryck Negron B Florence Yeung   Alfrieda Tarry, [place], [place] 04/22/24 1553

## 2024-04-25 LAB — AEROBIC CULTURE W GRAM STAIN (SUPERFICIAL SPECIMEN)

## 2024-04-26 ENCOUNTER — Ambulatory Visit (HOSPITAL_COMMUNITY): Payer: Self-pay

## 2024-08-27 ENCOUNTER — Ambulatory Visit: Admitting: Nurse Practitioner

## 2024-08-27 ENCOUNTER — Encounter: Payer: Self-pay | Admitting: Nurse Practitioner

## 2024-08-27 VITALS — BP 122/72 | HR 58 | Temp 97.8°F | Ht 67.5 in | Wt 212.0 lb

## 2024-08-27 DIAGNOSIS — D229 Melanocytic nevi, unspecified: Secondary | ICD-10-CM

## 2024-08-27 DIAGNOSIS — L729 Follicular cyst of the skin and subcutaneous tissue, unspecified: Secondary | ICD-10-CM | POA: Diagnosis not present

## 2024-08-27 DIAGNOSIS — R0681 Apnea, not elsewhere classified: Secondary | ICD-10-CM | POA: Diagnosis not present

## 2024-08-27 DIAGNOSIS — Z0001 Encounter for general adult medical examination with abnormal findings: Secondary | ICD-10-CM | POA: Diagnosis not present

## 2024-08-27 DIAGNOSIS — E7800 Pure hypercholesterolemia, unspecified: Secondary | ICD-10-CM

## 2024-08-27 DIAGNOSIS — E669 Obesity, unspecified: Secondary | ICD-10-CM

## 2024-08-27 DIAGNOSIS — L239 Allergic contact dermatitis, unspecified cause: Secondary | ICD-10-CM

## 2024-08-27 DIAGNOSIS — Z131 Encounter for screening for diabetes mellitus: Secondary | ICD-10-CM | POA: Diagnosis not present

## 2024-08-27 LAB — CBC WITH DIFFERENTIAL/PLATELET
Basophils Absolute: 0.1 K/uL (ref 0.0–0.1)
Basophils Relative: 0.9 % (ref 0.0–3.0)
Eosinophils Absolute: 0.2 K/uL (ref 0.0–0.7)
Eosinophils Relative: 2.2 % (ref 0.0–5.0)
HCT: 45 % (ref 39.0–52.0)
Hemoglobin: 15.4 g/dL (ref 13.0–17.0)
Lymphocytes Relative: 29 % (ref 12.0–46.0)
Lymphs Abs: 2.1 K/uL (ref 0.7–4.0)
MCHC: 34.2 g/dL (ref 30.0–36.0)
MCV: 84.8 fl (ref 78.0–100.0)
Monocytes Absolute: 0.7 K/uL (ref 0.1–1.0)
Monocytes Relative: 9.7 % (ref 3.0–12.0)
Neutro Abs: 4.2 K/uL (ref 1.4–7.7)
Neutrophils Relative %: 58.2 % (ref 43.0–77.0)
Platelets: 319 K/uL (ref 150.0–400.0)
RBC: 5.3 Mil/uL (ref 4.22–5.81)
RDW: 13.2 % (ref 11.5–15.5)
WBC: 7.2 K/uL (ref 4.0–10.5)

## 2024-08-27 LAB — COMPREHENSIVE METABOLIC PANEL WITH GFR
ALT: 28 U/L (ref 0–53)
AST: 19 U/L (ref 0–37)
Albumin: 4.5 g/dL (ref 3.5–5.2)
Alkaline Phosphatase: 75 U/L (ref 39–117)
BUN: 14 mg/dL (ref 6–23)
CO2: 28 meq/L (ref 19–32)
Calcium: 9.3 mg/dL (ref 8.4–10.5)
Chloride: 102 meq/L (ref 96–112)
Creatinine, Ser: 1.01 mg/dL (ref 0.40–1.50)
GFR: 100.25 mL/min (ref >60.00)
Glucose, Bld: 52 mg/dL — ABNORMAL LOW (ref 70–99)
Potassium: 4.5 meq/L (ref 3.5–5.1)
Sodium: 137 meq/L (ref 135–145)
Total Bilirubin: 0.7 mg/dL (ref 0.2–1.2)
Total Protein: 6.8 g/dL (ref 6.0–8.3)

## 2024-08-27 LAB — TSH: TSH: 1.66 u[IU]/mL (ref 0.35–5.50)

## 2024-08-27 LAB — LIPID PANEL
Cholesterol: 229 mg/dL — ABNORMAL HIGH (ref 0–200)
HDL: 35 mg/dL — ABNORMAL LOW (ref >39.00)
LDL Cholesterol: 160 mg/dL — ABNORMAL HIGH (ref 0–99)
NonHDL: 194.32
Total CHOL/HDL Ratio: 7
Triglycerides: 172 mg/dL — ABNORMAL HIGH (ref 0.0–149.0)
VLDL: 34.4 mg/dL (ref 0.0–40.0)

## 2024-08-27 LAB — HEMOGLOBIN A1C: Hgb A1c MFr Bld: 5.6 % (ref 4.6–6.5)

## 2024-08-27 MED ORDER — LEVOCETIRIZINE DIHYDROCHLORIDE 5 MG PO TABS: 5.0000 mg | ORAL_TABLET | Freq: Every evening | ORAL | 0 refills | Status: AC

## 2024-08-27 NOTE — Assessment & Plan Note (Signed)
 Patient has freckles and nevus.  Ambulatory referral to dermatology for skin survey

## 2024-08-27 NOTE — Progress Notes (Signed)
 Established Patient Office Visit  Subjective   Patient ID: Andre Fisher, male    DOB: [birth year]  Age: 28 y.o. MRN: [id number]  Chief Complaint  Patient presents with   Annual Exam    Requests lab work.     HPI   Skin rash: Patient is accompanied by his wife and they have a concern for rashes that are appearing in different parts of patient's body.  States that he would take Benadryl and use hydrocortisone cream this will help with itching it is resolved and the rash returns.  He has areas around where his sleeves of his shirt lay on his arms, around his neck, and sometimes on his chest.  He is the only one in the house that has the rash spouse does not.  for complete physical and follow up of chronic conditions.  Immunizations: -Tetanus: Completed in 2023 -Influenza: Refused -Shingles: Too young -Pneumonia: Too young - HPV: Up-to-date?  Diet: Fair diet. He is eating 2 meals a day. States that at work he will do breakfast samll lunch and dinner. He will do coffee water, soda,  Exercise: No regular exercise. He will go walking 1-2 times a week 2 miles   Eye exam: PRN  Dental exam: Completes semi-annually    Colonoscopy too young, currently average risk Lung Cancer Screening: N/A  PSA: Too young, currently average risk  Sleep: goes to bed 1130-12 and will get up around 630. Feels rested mostly. Does snore.  Wife states she is noticed episodes of where he pauses his breathing in his sleep and then will start back.  She has concern for sleep apnea    Review of Systems  Constitutional:  Negative for chills and fever.  Respiratory:  Negative for shortness of breath.   Cardiovascular:  Negative for chest pain and leg swelling.  Gastrointestinal:  Negative for abdominal pain, blood in stool, constipation, diarrhea, nausea and vomiting.       Bm daily   Genitourinary:  Negative for dysuria and hematuria.  Neurological:  Negative for dizziness, tingling and headaches.   Psychiatric/Behavioral:  Negative for hallucinations and suicidal ideas.       Objective:     BP 122/72   Pulse (!) 58   Temp 97.8 F (36.6 C) (Oral)   Ht 5' 7.5 (1.715 m)   Wt 212 lb (96.2 kg)   SpO2 98%   BMI 32.71 kg/m  BP Readings from Last 3 Encounters:  08/27/24 122/72  04/22/24 117/77  03/05/22 116/62   Wt Readings from Last 3 Encounters:  08/27/24 212 lb (96.2 kg)  03/05/22 190 lb 6.4 oz (86.4 kg)  01/06/22 193 lb 4 oz (87.7 kg)   SpO2 Readings from Last 3 Encounters:  08/27/24 98%  04/22/24 98%  03/05/22 97%      Physical Exam Vitals and nursing note reviewed.  Constitutional:      Appearance: Normal appearance.  HENT:     Right Ear: Tympanic membrane, ear canal and external ear normal.     Left Ear: Tympanic membrane, ear canal and external ear normal.     Mouth/Throat:     Mouth: Mucous membranes are moist.     Pharynx: Oropharynx is clear.  Eyes:     Extraocular Movements: Extraocular movements intact.     Pupils: Pupils are equal, round, and reactive to light.  Cardiovascular:     Rate and Rhythm: Normal rate and regular rhythm.     Pulses: Normal pulses.  Heart sounds: Normal heart sounds.  Pulmonary:     Effort: Pulmonary effort is normal.     Breath sounds: Normal breath sounds.  Abdominal:     General: Bowel sounds are normal. There is no distension.     Palpations: There is no mass.     Tenderness: There is no abdominal tenderness.     Hernia: No hernia is present.  Musculoskeletal:     Right lower leg: No edema.     Left lower leg: No edema.  Lymphadenopathy:     Cervical: No cervical adenopathy.  Skin:    General: Skin is warm.     Findings: Lesion and rash present.         Comments: Pruritic papular rash that is erythematous in nature.  Neurological:     General: No focal deficit present.     Mental Status: He is alert.     Deep Tendon Reflexes:     Reflex Scores:      Bicep reflexes are 2+ on the right side and 2+ on  the left side.      Patellar reflexes are 2+ on the right side and 2+ on the left side.    Comments: Bilateral upper and lower extremity strength 5/5  Psychiatric:        Mood and Affect: Mood normal.        Behavior: Behavior normal.        Thought Content: Thought content normal.        Judgment: Judgment normal.      No results found for any visits on 08/27/24.    The ASCVD Risk score (Arnett DK, et al., 2019) failed to calculate for the following reasons:   The 2019 ASCVD risk score is only valid for ages 53 to 78    Assessment & Plan:   Problem List Items Addressed This Visit       Musculoskeletal and Integument   Allergic dermatitis   Will start levocetirizine 5 mg daily.  Can use hydrocortisone cream over-the-counter as needed.  If no improvement consider allergy testing or referral to dermatology      Relevant Medications   levocetirizine (XYZAL) 5 MG tablet   Cyst of skin   2 benign-appearing cyst evident present for extended period of time without growing causing trouble.  Continue watchful waiting.  Patient was referred to dermatology for skin survey if concern can bring up at that appointment      Relevant Orders   Ambulatory referral to Dermatology     Other   Preventative health care - Primary   Discussed age-appropriate immunizations and screening exams.  Did review patient's personal, surgical, social, family histories.  Patient up-to-date on all age-appropriate vaccinations he would like.  Patient declined flu vaccine today.  Patient is too young for CRC screening or prostate cancer screening.  Patient was given information at discharge about preventative healthcare maintenance with anticipatory guidance.      Elevated LDL cholesterol level   History of the same.  Pending lipid panel today      Relevant Orders   Lipid panel   Obesity (BMI 30-39.9)   Pending TSH, A1c, lipid panel.  Discussed healthy lifestyle modifications inclusive of the medical  recommendation of exercise of 30 minutes a day 5 times a week.      Witnessed episode of apnea   Spouse is witnessed episodes of apnea.  Patient has gained approximate 20 pounds since last office visit with me.  Will  do at home sleep study to rule out sleep apnea.      Relevant Orders   Ambulatory referral to Sleep Studies   Multiple nevi   Patient has freckles and nevus.  Ambulatory referral to dermatology for skin survey      Relevant Orders   Ambulatory referral to Dermatology   Other Visit Diagnoses       Screening for diabetes mellitus       Relevant Orders   Hemoglobin A1c       Return in about 1 year (around 08/27/2025) for CPE and Labs.    Adina Crandall, NP

## 2024-08-27 NOTE — Assessment & Plan Note (Signed)
 History of the same.  Pending lipid panel today

## 2024-08-27 NOTE — Assessment & Plan Note (Signed)
 Spouse is witnessed episodes of apnea.  Patient has gained approximate 20 pounds since last office visit with me.  Will do at home sleep study to rule out sleep apnea.

## 2024-08-27 NOTE — Assessment & Plan Note (Signed)
 Will start levocetirizine 5 mg daily.  Can use hydrocortisone cream over-the-counter as needed.  If no improvement consider allergy testing or referral to dermatology

## 2024-08-27 NOTE — Patient Instructions (Signed)
 Nice to see you today I will be in touch with the labs once I have them Follow up with me in 1 year, sooner if you need me

## 2024-08-27 NOTE — Assessment & Plan Note (Signed)
 2 benign-appearing cyst evident present for extended period of time without growing causing trouble.  Continue watchful waiting.  Patient was referred to dermatology for skin survey if concern can bring up at that appointment

## 2024-08-27 NOTE — Assessment & Plan Note (Signed)
 Discussed age-appropriate immunizations and screening exams.  Did review patient's personal, surgical, social, family histories.  Patient up-to-date on all age-appropriate vaccinations he would like.  Patient declined flu vaccine today.  Patient is too young for CRC screening or prostate cancer screening.  Patient was given information at discharge about preventative healthcare maintenance with anticipatory guidance.

## 2024-08-27 NOTE — Assessment & Plan Note (Signed)
 Pending TSH, A1c, lipid panel.  Discussed healthy lifestyle modifications inclusive of the medical recommendation of exercise of 30 minutes a day 5 times a week.

## 2024-08-31 ENCOUNTER — Ambulatory Visit: Payer: Self-pay | Admitting: Nurse Practitioner

## 2024-08-31 DIAGNOSIS — E7800 Pure hypercholesterolemia, unspecified: Secondary | ICD-10-CM

## 2024-08-31 DIAGNOSIS — Z8249 Family history of ischemic heart disease and other diseases of the circulatory system: Secondary | ICD-10-CM

## 2024-10-19 ENCOUNTER — Encounter: Admitting: Nurse Practitioner

## 2024-12-07 ENCOUNTER — Encounter: Admitting: Nurse Practitioner

## 2025-02-15 ENCOUNTER — Ambulatory Visit: Admitting: Nurse Practitioner

## 2025-08-29 ENCOUNTER — Encounter: Admitting: Nurse Practitioner
